# Patient Record
Sex: Female | Born: 1975 | Race: Black or African American | Hispanic: No | Smoking: Former smoker
Health system: Southern US, Community
[De-identification: ages and names within clinical notes are randomized; demographics above are authoritative.]

## PROBLEM LIST (undated history)

## (undated) DIAGNOSIS — K219 Gastro-esophageal reflux disease without esophagitis: Secondary | ICD-10-CM

## (undated) DIAGNOSIS — K589 Irritable bowel syndrome without diarrhea: Secondary | ICD-10-CM

## (undated) DIAGNOSIS — I10 Essential (primary) hypertension: Secondary | ICD-10-CM

## (undated) DIAGNOSIS — E669 Obesity, unspecified: Secondary | ICD-10-CM

## (undated) DIAGNOSIS — D649 Anemia, unspecified: Secondary | ICD-10-CM

## (undated) DIAGNOSIS — B1920 Unspecified viral hepatitis C without hepatic coma: Secondary | ICD-10-CM

## (undated) DIAGNOSIS — R768 Other specified abnormal immunological findings in serum: Secondary | ICD-10-CM

## (undated) DIAGNOSIS — G4730 Sleep apnea, unspecified: Secondary | ICD-10-CM

## (undated) DIAGNOSIS — F419 Anxiety disorder, unspecified: Secondary | ICD-10-CM

## (undated) DIAGNOSIS — D1803 Hemangioma of intra-abdominal structures: Secondary | ICD-10-CM

## (undated) HISTORY — DX: Anxiety disorder, unspecified: F41.9

## (undated) HISTORY — DX: Anemia, unspecified: D64.9

## (undated) HISTORY — PX: NASAL SINUS SURGERY: SHX719

## (undated) HISTORY — DX: Hemangioma of intra-abdominal structures: D18.03

## (undated) HISTORY — DX: Obesity, unspecified: E66.9

## (undated) HISTORY — DX: Gastro-esophageal reflux disease without esophagitis: K21.9

## (undated) HISTORY — DX: Sleep apnea, unspecified: G47.30

## (undated) HISTORY — DX: Other specified abnormal immunological findings in serum: R76.8

## (undated) HISTORY — DX: Irritable bowel syndrome, unspecified: K58.9

## (undated) HISTORY — DX: Essential (primary) hypertension: I10

## (undated) HISTORY — PX: TUBAL LIGATION: SHX77

## (undated) HISTORY — PX: DENTAL SURGERY: SHX609

---

## 1998-02-06 ENCOUNTER — Encounter: Admission: RE | Admit: 1998-02-06 | Discharge: 1998-02-06 | Payer: Self-pay | Admitting: Family Medicine

## 1998-03-21 ENCOUNTER — Emergency Department (HOSPITAL_COMMUNITY): Admission: EM | Admit: 1998-03-21 | Discharge: 1998-03-21 | Payer: Self-pay | Admitting: Emergency Medicine

## 1998-05-04 ENCOUNTER — Encounter: Admission: RE | Admit: 1998-05-04 | Discharge: 1998-05-04 | Payer: Self-pay | Admitting: Family Medicine

## 1998-05-05 ENCOUNTER — Encounter: Admission: RE | Admit: 1998-05-05 | Discharge: 1998-05-05 | Payer: Self-pay | Admitting: Family Medicine

## 1998-05-23 ENCOUNTER — Encounter: Admission: RE | Admit: 1998-05-23 | Discharge: 1998-05-23 | Payer: Self-pay | Admitting: Family Medicine

## 1998-06-01 ENCOUNTER — Encounter: Admission: RE | Admit: 1998-06-01 | Discharge: 1998-06-01 | Payer: Self-pay | Admitting: Family Medicine

## 1998-06-22 ENCOUNTER — Other Ambulatory Visit: Admission: RE | Admit: 1998-06-22 | Discharge: 1998-06-22 | Payer: Self-pay | Admitting: *Deleted

## 1998-06-22 ENCOUNTER — Encounter: Admission: RE | Admit: 1998-06-22 | Discharge: 1998-06-22 | Payer: Self-pay | Admitting: Family Medicine

## 1998-08-04 ENCOUNTER — Encounter: Admission: RE | Admit: 1998-08-04 | Discharge: 1998-08-04 | Payer: Self-pay | Admitting: Family Medicine

## 1998-11-08 ENCOUNTER — Encounter: Admission: RE | Admit: 1998-11-08 | Discharge: 1998-11-08 | Payer: Self-pay | Admitting: Family Medicine

## 1999-02-22 ENCOUNTER — Encounter: Admission: RE | Admit: 1999-02-22 | Discharge: 1999-02-22 | Payer: Self-pay | Admitting: Family Medicine

## 1999-03-08 ENCOUNTER — Encounter: Admission: RE | Admit: 1999-03-08 | Discharge: 1999-03-08 | Payer: Self-pay | Admitting: Family Medicine

## 1999-10-04 ENCOUNTER — Encounter: Admission: RE | Admit: 1999-10-04 | Discharge: 1999-10-04 | Payer: Self-pay | Admitting: Family Medicine

## 2000-04-29 ENCOUNTER — Other Ambulatory Visit: Admission: RE | Admit: 2000-04-29 | Discharge: 2000-04-29 | Payer: Self-pay | Admitting: Family Medicine

## 2000-05-06 ENCOUNTER — Encounter: Admission: RE | Admit: 2000-05-06 | Discharge: 2000-05-06 | Payer: Self-pay | Admitting: Sports Medicine

## 2000-06-24 ENCOUNTER — Encounter: Admission: RE | Admit: 2000-06-24 | Discharge: 2000-06-24 | Payer: Self-pay | Admitting: Family Medicine

## 2000-07-16 ENCOUNTER — Encounter: Admission: RE | Admit: 2000-07-16 | Discharge: 2000-07-16 | Payer: Self-pay | Admitting: Family Medicine

## 2000-08-18 ENCOUNTER — Encounter: Admission: RE | Admit: 2000-08-18 | Discharge: 2000-08-18 | Payer: Self-pay | Admitting: Family Medicine

## 2000-09-16 ENCOUNTER — Encounter: Admission: RE | Admit: 2000-09-16 | Discharge: 2000-09-16 | Payer: Self-pay | Admitting: Family Medicine

## 2000-09-30 ENCOUNTER — Ambulatory Visit (HOSPITAL_COMMUNITY): Admission: RE | Admit: 2000-09-30 | Discharge: 2000-09-30 | Payer: Self-pay

## 2000-10-09 ENCOUNTER — Encounter: Admission: RE | Admit: 2000-10-09 | Discharge: 2000-10-09 | Payer: Self-pay | Admitting: Family Medicine

## 2000-11-10 ENCOUNTER — Encounter: Admission: RE | Admit: 2000-11-10 | Discharge: 2000-11-10 | Payer: Self-pay | Admitting: Family Medicine

## 2000-11-14 ENCOUNTER — Encounter: Admission: RE | Admit: 2000-11-14 | Discharge: 2000-11-14 | Payer: Self-pay | Admitting: Family Medicine

## 2000-12-09 ENCOUNTER — Encounter: Admission: RE | Admit: 2000-12-09 | Discharge: 2000-12-09 | Payer: Self-pay | Admitting: Sports Medicine

## 2000-12-25 ENCOUNTER — Encounter: Admission: RE | Admit: 2000-12-25 | Discharge: 2000-12-25 | Payer: Self-pay | Admitting: Family Medicine

## 2001-01-07 ENCOUNTER — Encounter: Admission: RE | Admit: 2001-01-07 | Discharge: 2001-01-07 | Payer: Self-pay | Admitting: Family Medicine

## 2001-01-26 ENCOUNTER — Encounter: Admission: RE | Admit: 2001-01-26 | Discharge: 2001-01-26 | Payer: Self-pay | Admitting: Family Medicine

## 2001-02-03 ENCOUNTER — Encounter: Admission: RE | Admit: 2001-02-03 | Discharge: 2001-02-03 | Payer: Self-pay | Admitting: Family Medicine

## 2001-02-10 ENCOUNTER — Encounter: Admission: RE | Admit: 2001-02-10 | Discharge: 2001-02-10 | Payer: Self-pay | Admitting: Family Medicine

## 2001-02-10 ENCOUNTER — Inpatient Hospital Stay (HOSPITAL_COMMUNITY): Admission: AD | Admit: 2001-02-10 | Discharge: 2001-02-10 | Payer: Self-pay | Admitting: *Deleted

## 2001-02-14 ENCOUNTER — Inpatient Hospital Stay (HOSPITAL_COMMUNITY): Admission: AD | Admit: 2001-02-14 | Discharge: 2001-02-14 | Payer: Self-pay | Admitting: *Deleted

## 2001-02-14 ENCOUNTER — Inpatient Hospital Stay (HOSPITAL_COMMUNITY): Admission: AD | Admit: 2001-02-14 | Discharge: 2001-02-17 | Payer: Self-pay | Admitting: *Deleted

## 2001-02-14 ENCOUNTER — Encounter (INDEPENDENT_AMBULATORY_CARE_PROVIDER_SITE_OTHER): Payer: Self-pay | Admitting: Specialist

## 2001-04-08 ENCOUNTER — Encounter: Admission: RE | Admit: 2001-04-08 | Discharge: 2001-04-08 | Payer: Self-pay | Admitting: Family Medicine

## 2001-04-08 ENCOUNTER — Other Ambulatory Visit: Admission: RE | Admit: 2001-04-08 | Discharge: 2001-04-08 | Payer: Self-pay | Admitting: Family Medicine

## 2001-10-08 ENCOUNTER — Encounter: Admission: RE | Admit: 2001-10-08 | Discharge: 2001-10-08 | Payer: Self-pay | Admitting: Family Medicine

## 2001-10-16 ENCOUNTER — Encounter: Admission: RE | Admit: 2001-10-16 | Discharge: 2001-10-16 | Payer: Self-pay | Admitting: Family Medicine

## 2001-10-16 ENCOUNTER — Encounter: Payer: Self-pay | Admitting: Family Medicine

## 2001-11-06 ENCOUNTER — Encounter: Admission: RE | Admit: 2001-11-06 | Discharge: 2001-11-06 | Payer: Self-pay | Admitting: Family Medicine

## 2002-05-19 ENCOUNTER — Encounter: Admission: RE | Admit: 2002-05-19 | Discharge: 2002-05-19 | Payer: Self-pay | Admitting: Family Medicine

## 2002-05-19 ENCOUNTER — Other Ambulatory Visit: Admission: RE | Admit: 2002-05-19 | Discharge: 2002-05-19 | Payer: Self-pay | Admitting: Family Medicine

## 2002-09-28 ENCOUNTER — Encounter: Admission: RE | Admit: 2002-09-28 | Discharge: 2002-09-28 | Payer: Self-pay | Admitting: Family Medicine

## 2002-11-18 ENCOUNTER — Encounter: Admission: RE | Admit: 2002-11-18 | Discharge: 2002-11-18 | Payer: Self-pay | Admitting: Family Medicine

## 2002-12-09 ENCOUNTER — Encounter: Admission: RE | Admit: 2002-12-09 | Discharge: 2002-12-09 | Payer: Self-pay | Admitting: Family Medicine

## 2003-07-21 ENCOUNTER — Encounter: Admission: RE | Admit: 2003-07-21 | Discharge: 2003-07-21 | Payer: Self-pay | Admitting: Sports Medicine

## 2004-01-24 ENCOUNTER — Encounter (INDEPENDENT_AMBULATORY_CARE_PROVIDER_SITE_OTHER): Payer: Self-pay | Admitting: *Deleted

## 2004-01-24 LAB — CONVERTED CEMR LAB

## 2004-02-06 ENCOUNTER — Encounter: Admission: RE | Admit: 2004-02-06 | Discharge: 2004-02-06 | Payer: Self-pay | Admitting: Family Medicine

## 2004-08-15 ENCOUNTER — Other Ambulatory Visit: Admission: RE | Admit: 2004-08-15 | Discharge: 2004-08-15 | Payer: Self-pay | Admitting: Obstetrics and Gynecology

## 2005-06-13 ENCOUNTER — Emergency Department (HOSPITAL_COMMUNITY): Admission: EM | Admit: 2005-06-13 | Discharge: 2005-06-13 | Payer: Self-pay | Admitting: Emergency Medicine

## 2006-10-15 ENCOUNTER — Emergency Department (HOSPITAL_COMMUNITY): Admission: EM | Admit: 2006-10-15 | Discharge: 2006-10-15 | Payer: Self-pay | Admitting: Emergency Medicine

## 2006-12-18 DIAGNOSIS — G44209 Tension-type headache, unspecified, not intractable: Secondary | ICD-10-CM | POA: Insufficient documentation

## 2006-12-18 DIAGNOSIS — E669 Obesity, unspecified: Secondary | ICD-10-CM | POA: Insufficient documentation

## 2006-12-18 DIAGNOSIS — K219 Gastro-esophageal reflux disease without esophagitis: Secondary | ICD-10-CM

## 2006-12-18 DIAGNOSIS — Z6841 Body Mass Index (BMI) 40.0 and over, adult: Secondary | ICD-10-CM

## 2006-12-19 ENCOUNTER — Encounter (INDEPENDENT_AMBULATORY_CARE_PROVIDER_SITE_OTHER): Payer: Self-pay | Admitting: *Deleted

## 2007-07-01 ENCOUNTER — Ambulatory Visit: Payer: Self-pay | Admitting: Family Medicine

## 2007-07-01 ENCOUNTER — Encounter (INDEPENDENT_AMBULATORY_CARE_PROVIDER_SITE_OTHER): Payer: Self-pay | Admitting: Family Medicine

## 2007-07-01 DIAGNOSIS — L210 Seborrhea capitis: Secondary | ICD-10-CM

## 2007-07-01 DIAGNOSIS — R030 Elevated blood-pressure reading, without diagnosis of hypertension: Secondary | ICD-10-CM

## 2007-07-07 ENCOUNTER — Encounter (INDEPENDENT_AMBULATORY_CARE_PROVIDER_SITE_OTHER): Payer: Self-pay | Admitting: Family Medicine

## 2007-07-15 ENCOUNTER — Ambulatory Visit: Payer: Self-pay | Admitting: Family Medicine

## 2007-07-15 ENCOUNTER — Encounter (INDEPENDENT_AMBULATORY_CARE_PROVIDER_SITE_OTHER): Payer: Self-pay | Admitting: Family Medicine

## 2007-07-15 DIAGNOSIS — I10 Essential (primary) hypertension: Secondary | ICD-10-CM

## 2007-07-15 LAB — CONVERTED CEMR LAB
BUN: 11 mg/dL (ref 6–23)
Calcium: 9.2 mg/dL (ref 8.4–10.5)
Cholesterol: 172 mg/dL (ref 0–200)
Creatinine, Ser: 0.71 mg/dL (ref 0.40–1.20)
HDL: 60 mg/dL (ref >39)
Triglycerides: 53 mg/dL (ref <150)

## 2007-07-16 ENCOUNTER — Encounter (INDEPENDENT_AMBULATORY_CARE_PROVIDER_SITE_OTHER): Payer: Self-pay | Admitting: Family Medicine

## 2007-07-30 ENCOUNTER — Ambulatory Visit: Payer: Self-pay | Admitting: Family Medicine

## 2007-07-30 ENCOUNTER — Encounter (INDEPENDENT_AMBULATORY_CARE_PROVIDER_SITE_OTHER): Payer: Self-pay | Admitting: Family Medicine

## 2007-07-30 LAB — CONVERTED CEMR LAB
BUN: 11 mg/dL (ref 6–23)
CO2: 26 meq/L (ref 19–32)
Chloride: 102 meq/L (ref 96–112)
Creatinine, Ser: 0.68 mg/dL (ref 0.40–1.20)
Glucose, Bld: 103 mg/dL — ABNORMAL HIGH (ref 70–99)

## 2007-07-31 ENCOUNTER — Encounter (INDEPENDENT_AMBULATORY_CARE_PROVIDER_SITE_OTHER): Payer: Self-pay | Admitting: Family Medicine

## 2008-08-11 ENCOUNTER — Ambulatory Visit: Payer: Self-pay | Admitting: Family Medicine

## 2008-09-22 ENCOUNTER — Encounter: Payer: Self-pay | Admitting: *Deleted

## 2008-12-27 ENCOUNTER — Ambulatory Visit: Payer: Self-pay | Admitting: Family Medicine

## 2008-12-27 ENCOUNTER — Encounter: Payer: Self-pay | Admitting: Family Medicine

## 2008-12-27 DIAGNOSIS — R109 Unspecified abdominal pain: Secondary | ICD-10-CM | POA: Insufficient documentation

## 2008-12-29 ENCOUNTER — Ambulatory Visit (HOSPITAL_COMMUNITY): Admission: RE | Admit: 2008-12-29 | Discharge: 2008-12-29 | Payer: Self-pay | Admitting: Family Medicine

## 2009-01-02 ENCOUNTER — Encounter: Payer: Self-pay | Admitting: Family Medicine

## 2009-01-02 LAB — CONVERTED CEMR LAB
BUN: 12 mg/dL (ref 6–23)
CO2: 25 meq/L (ref 19–32)
Creatinine, Ser: 0.83 mg/dL (ref 0.40–1.20)
Glucose, Bld: 97 mg/dL (ref 70–99)
Sodium: 143 meq/L (ref 135–145)
Total Bilirubin: 0.2 mg/dL — ABNORMAL LOW (ref 0.3–1.2)
Total Protein: 8 g/dL (ref 6.0–8.3)

## 2009-03-15 ENCOUNTER — Ambulatory Visit: Payer: Self-pay | Admitting: Family Medicine

## 2009-03-15 LAB — CONVERTED CEMR LAB
H Pylori IgG: POSITIVE
HCT: 34.3 % — ABNORMAL LOW (ref 36.0–46.0)
Platelets: 400 10*3/uL (ref 150–400)
RBC: 3.89 M/uL (ref 3.87–5.11)
WBC: 8.8 10*3/uL (ref 4.0–10.5)

## 2009-03-16 ENCOUNTER — Telehealth: Payer: Self-pay | Admitting: Family Medicine

## 2009-05-10 ENCOUNTER — Telehealth: Payer: Self-pay | Admitting: Family Medicine

## 2009-12-11 DIAGNOSIS — R768 Other specified abnormal immunological findings in serum: Secondary | ICD-10-CM

## 2009-12-11 HISTORY — DX: Other specified abnormal immunological findings in serum: R76.8

## 2010-02-07 ENCOUNTER — Ambulatory Visit: Payer: Self-pay | Admitting: Family Medicine

## 2010-02-07 ENCOUNTER — Encounter: Payer: Self-pay | Admitting: Family Medicine

## 2010-02-07 DIAGNOSIS — Z8619 Personal history of other infectious and parasitic diseases: Secondary | ICD-10-CM | POA: Insufficient documentation

## 2010-02-07 DIAGNOSIS — A048 Other specified bacterial intestinal infections: Secondary | ICD-10-CM

## 2010-02-07 LAB — CONVERTED CEMR LAB
Albumin: 4.2 g/dL (ref 3.5–5.2)
BUN: 8 mg/dL (ref 6–23)
Calcium: 9.2 mg/dL (ref 8.4–10.5)
Chloride: 101 meq/L (ref 96–112)
Glucose, Bld: 94 mg/dL (ref 70–99)
HDL: 51 mg/dL (ref >39)
Hemoglobin: 11.2 g/dL — ABNORMAL LOW (ref 12.0–15.0)
MCHC: 32.9 g/dL (ref 30.0–36.0)
Potassium: 4.1 meq/L (ref 3.5–5.3)
RBC: 3.88 M/uL (ref 3.87–5.11)
Triglycerides: 103 mg/dL (ref <150)

## 2010-02-08 ENCOUNTER — Telehealth: Payer: Self-pay | Admitting: Family Medicine

## 2010-11-20 NOTE — Progress Notes (Signed)
Summary: refill  Phone Note Refill Request Call back at Home Phone [phone] Message from:  Patient  Refills Requested: Medication #1:  LISINOPRIL-HYDROCHLOROTHIAZIDE 20-25 MG TABS take one tablet daily Initial call taken by: De Nurse,  February 08, 2010 11:18 AM Caller: Patient Initial call taken by: De Nurse,  February 08, 2010 11:17 AM    I refilled it with 5 refills right before she called.  [facility] [address].  Let me know if they do not recieve it in the next severla hours.  Thanks! Delbert Harness MD  February 08, 2010 11:23 AM
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
## 2010-11-20 NOTE — Progress Notes (Signed)
Summary: positive H. Pylori  Phone Note Outgoing Call   Summary of Call: Left message on patient's voicemail.  I told her H. Pylori is positive and I have sent in prescription for amoxicillin and clarithromycin for two week course and I woudl like her to take protonix twice a day for 2 weeks, then go back to once a day.  If she continues to have pain after treatment, needs to return.   I would retest H. Pylori in 4 weeks.  Also advised multivitamin for slightly low hemoglobin- may be due to gastritis.  Would also recheck in future. Initial call taken by: Delbert Harness MD,  February 08, 2010 11:12 AM

## 2010-12-05 ENCOUNTER — Encounter: Payer: Self-pay | Admitting: *Deleted

## 2011-02-11 ENCOUNTER — Other Ambulatory Visit: Payer: Self-pay | Admitting: Family Medicine

## 2011-02-11 NOTE — Telephone Encounter (Signed)
Refill request

## 2011-02-13 NOTE — Telephone Encounter (Signed)
Has not been seen since 02/07/10

## 2011-02-13 NOTE — Telephone Encounter (Signed)
Refill request

## 2011-02-27 ENCOUNTER — Encounter: Payer: Self-pay | Admitting: Family Medicine

## 2011-03-08 NOTE — Op Note (Signed)
[facility]  Patient:    Kristin Lawrence, Kristin Lawrence                       MRN: [id number] Proc. Date: 02/13/01 Adm. Date:  [id number] Disc. Date: [id number] Attending:  Michaelle Copas Dictator:   Jamey Reas, M.D.                           Operative Report  DATE OF BIRTH:                [birth year]  PREOPERATIVE DIAGNOSIS:       Multiparity, desires permanent                               sterilization.  POSTOPERATIVE DIAGNOSIS:      Multiparity, desires permanent                               sterilization.  PROCEDURE:                    Postpartum tubal ligation, Pomeroy method.  SURGEON:                      Roseanna Rainbow, M.D.  ASSISTANT:                    Jamey Reas, M.D.  ANESTHESIA:                   Epidural.  COMPLICATIONS:                None.  ESTIMATED BLOOD LOSS:         Less than 20 cc.  FLUIDS:                       Less than 500 Lactated Ringers.  INDICATION:                   A 35 year old G5 now P2 0 3 2 postpartum day #2 status post normal spontaneous vaginal delivery of a female, doing well.  Risks and benefits of the procedure were discussed with the patient including a risk of failure 3-5:1000 with an increased risk of ectopic gestation if pregnancy occurs.  FINDINGS:                     Normal uterus, tubes, and ovaries.  PROCEDURE:                    The patient was taken to the operating room, where epidural was found to be adequate.  A small, transverse infraumbilical skin incision was then made with the scalpel.  The incision was then carried down through the underlying fascia, until the peritoneum was identified and entered.  The peritoneum was noted to be free of any adhesions and the incision was then extended with the Metzenbaum scissors.  The patients left fallopian tube was then identified, brought to the incision, and grasped with a Babcock clamp.  The tube was then followed out  to the fimbriae.  The Babcock clamp was then used to grasp the tube approximately 4 cm from the ______ region.  A 3 cm segment of the tube was then ligated and  two free ties of plain gut and excised.  Good hemostasis was noted and the tube was returned to the abdomen.  The right fallopian tube was then ligated in a similar fashion and a 3 cm segment excised.  Excellent hemostasis was noted and the tube returned to the abdomen.  The peritoneum and fascia were then closed in a single layer using 3-0 Vicryl suture.  The skin was then closed in a subcuticular fashion using 4-0 Monocryl suture.  The patient tolerated the procedure well.  Sponge, lap, and needle counts were correct x 2.  The patient was taken to the recovery room in stable condition.  Pathology segments of right and left fallopian tubes. DD:  02/16/01 TD:  02/16/01 Job: [id number]

## 2011-03-13 ENCOUNTER — Encounter: Payer: Self-pay | Admitting: Family Medicine

## 2011-03-21 ENCOUNTER — Encounter: Payer: Self-pay | Admitting: Family Medicine

## 2011-03-21 ENCOUNTER — Other Ambulatory Visit (HOSPITAL_COMMUNITY)
Admission: RE | Admit: 2011-03-21 | Discharge: 2011-03-21 | Disposition: A | Discharge status: Home / Self Care | Payer: 59 | Source: Ambulatory Visit | Attending: Family Medicine | Admitting: Family Medicine

## 2011-03-21 ENCOUNTER — Ambulatory Visit (INDEPENDENT_AMBULATORY_CARE_PROVIDER_SITE_OTHER): Payer: 59 | Admitting: Family Medicine

## 2011-03-21 VITALS — BP 131/76 | HR 70 | Temp 98.7°F | Ht 66.75 in | Wt 280.0 lb

## 2011-03-21 DIAGNOSIS — N739 Female pelvic inflammatory disease, unspecified: Secondary | ICD-10-CM

## 2011-03-21 DIAGNOSIS — A499 Bacterial infection, unspecified: Secondary | ICD-10-CM

## 2011-03-21 DIAGNOSIS — Z01419 Encounter for gynecological examination (general) (routine) without abnormal findings: Secondary | ICD-10-CM | POA: Insufficient documentation

## 2011-03-21 DIAGNOSIS — E669 Obesity, unspecified: Secondary | ICD-10-CM

## 2011-03-21 DIAGNOSIS — Z124 Encounter for screening for malignant neoplasm of cervix: Secondary | ICD-10-CM

## 2011-03-21 DIAGNOSIS — B9689 Other specified bacterial agents as the cause of diseases classified elsewhere: Secondary | ICD-10-CM

## 2011-03-21 DIAGNOSIS — N760 Acute vaginitis: Secondary | ICD-10-CM

## 2011-03-21 DIAGNOSIS — A048 Other specified bacterial intestinal infections: Secondary | ICD-10-CM

## 2011-03-21 DIAGNOSIS — E282 Polycystic ovarian syndrome: Secondary | ICD-10-CM | POA: Insufficient documentation

## 2011-03-21 LAB — CBC
MCH: 29.6 pg (ref 26.0–34.0)
MCV: 89.4 fL (ref 78.0–100.0)
Platelets: 308 10*3/uL (ref 150–400)
RBC: 4.05 MIL/uL (ref 3.87–5.11)
RDW: 13.7 % (ref 11.5–15.5)

## 2011-03-21 LAB — POCT WET PREP (WET MOUNT): Trichomonas Wet Prep HPF POC: NEGATIVE

## 2011-03-21 LAB — LIPID PANEL
Cholesterol: 179 mg/dL (ref 0–200)
HDL: 57 mg/dL (ref >39)
Total CHOL/HDL Ratio: 3.1 Ratio
Triglycerides: 85 mg/dL (ref <150)
VLDL: 17 mg/dL (ref 0–40)

## 2011-03-21 LAB — HIV ANTIBODY (ROUTINE TESTING W REFLEX): HIV: NONREACTIVE

## 2011-03-21 LAB — COMPREHENSIVE METABOLIC PANEL
ALT: 25 U/L (ref 0–35)
BUN: 10 mg/dL (ref 6–23)
CO2: 31 mEq/L (ref 19–32)
Creat: 0.76 mg/dL (ref 0.40–1.20)
Total Bilirubin: 0.4 mg/dL (ref 0.3–1.2)

## 2011-03-21 MED ORDER — OMEPRAZOLE 20 MG PO CPDR: 20.0000 mg | DELAYED_RELEASE_CAPSULE | Freq: Every day | ORAL | Status: DC

## 2011-03-21 NOTE — Assessment & Plan Note (Signed)
Screened for STDS given open relationships.  Pap today, up to date on recommended preventative care.

## 2011-03-21 NOTE — Patient Instructions (Addendum)
Congrats on your weight loss!  Think about adding some daily exercise to your everyday activity level If your insurance covers  Nutrition counseling (tell them you have high blood pressure) that would be a good option for you I will send you letter in the mail with your normal results in 1-2 weeks. Follow-up in 6 months or sooner if neededAcid Reflux (GERD) Acid reflux is also called gastroesophageal reflux disease (GERD). Your stomach makes acid to help digest food. Acid reflux happens when acid from your stomach goes into the tube between your mouth and stomach (esophagus). Your stomach is protected from the acid, but this tube is not. When acid gets into the tube, it may cause a burning feeling in the chest (heartburn). Besides heartburn, other health problems can happen if the acid keeps going into the tube. Some causes of acid reflux include:  Being overweight.   Smoking.   Drinking alcohol.   Eating large meals.   Eating meals and then going to bed right away.   Eating certain foods.   Increased stomach acid production.  HOME CARE  Take all medicine as told by your doctor.   You may need to:   Lose weight.   Avoid alcohol.   Quit smoking.   Do not eat big meals. It is better to eat smaller meals throughout the day.   Do not eat a meal and then nap or go to bed.   Sleep with your head higher than your stomach.   Avoid foods that bother you.   You may need more tests, or you may need to see a special doctor.  GET HELP RIGHT AWAY IF:  You have chest pain that is different than before.   You have pain that goes to your arms, jaw, or between your shoulder blades.   You throw up (vomit) blood, dark brown liquid, or your throw up looks like coffee grounds.   You have trouble swallowing.   You have trouble breathing or cannot stop coughing.   You feel dizzy or pass out.   Your skin is cool, wet, and pale.   Your medicine is not helping.  MAKE SURE YOU:     Understand these instructions.   Will watch your condition.   Will get help right away if you are not doing well or get worse.  Document Released: 03/25/2008 Document Re-Released: 01/01/2010 [facility] Patient Information 2011 [place], [place].

## 2011-03-21 NOTE — Progress Notes (Signed)
  Subjective:    Patient ID: Kristin Lawrence, female    DOB: 01-27-1976, 35 y.o.   MRN: [id number]  HPI Annual Gynecological Exam  [phone] Wt Readings from Last 3 Encounters:  03/21/11 280 lb (127.007 kg)  02/07/10 304 lb 3.2 oz (137.984 kg)  03/15/09 297 lb 11.2 oz (135.036 kg)   Last period: 03/11/2011 Regular periods: yes Heavy bleeding: no  Sexually active: yes Birth control or hormonal therapy:no Hx of STD: Patient desires STD screening, yes open marraige Dyspareunia: No Hot flashes: No Vaginal discharge: normal mucous Dysuria:No   Last mammogram: none Breast mass or concerns: No Last Pap:08/2008 History of abnormal pap: No  FH of breast, uterine, ovarian, colon cancer: yes- colon ca in mom     Review of Systems  Constitutional: Negative for fever and unexpected weight change.  Respiratory: Negative for cough and chest tightness.   Cardiovascular: Negative for chest pain.  Gastrointestinal: Negative for nausea, abdominal pain, diarrhea and constipation.  Genitourinary: Negative for dysuria.  Neurological: Negative for dizziness and headaches.  Psychiatric/Behavioral: Negative for sleep disturbance.       Objective:   Physical Exam  Constitutional: She appears well-developed and well-nourished.  HENT:  Head: Normocephalic and atraumatic.  Right Ear: External ear normal.  Left Ear: External ear normal.  Nose: Nose normal.  Mouth/Throat: Oropharynx is clear and moist. No oropharyngeal exudate.  Eyes: Conjunctivae and EOM are normal. Pupils are equal, round, and reactive to light.  Neck: Normal range of motion. Neck supple. No thyromegaly present.  Cardiovascular: Normal rate, regular rhythm and normal heart sounds.   No murmur heard. Pulmonary/Chest: Effort normal and breath sounds normal. No respiratory distress. She has no wheezes. She has no rhonchi. She has no rales.  Abdominal: Soft. Bowel sounds are normal. There is no hepatosplenomegaly. There is no  tenderness. There is no rebound and no guarding.  Genitourinary: Vagina normal and uterus normal. No breast tenderness or discharge. Cervix exhibits no motion tenderness, no discharge and no friability. Right adnexum displays no mass, no tenderness and no fullness. Left adnexum displays no mass and no tenderness. No bleeding around the vagina. No vaginal discharge found.  Lymphadenopathy:    She has no cervical adenopathy.          Assessment & Plan:

## 2011-03-21 NOTE — Assessment & Plan Note (Signed)
Has lost almost 25 pounds in the past year. Congratulated her improvement.  Encouraged continued progress and offered nutrition counseling if desired.

## 2011-03-22 DIAGNOSIS — B9689 Other specified bacterial agents as the cause of diseases classified elsewhere: Secondary | ICD-10-CM | POA: Insufficient documentation

## 2011-03-22 MED ORDER — METRONIDAZOLE 500 MG PO TABS: 500.0000 mg | ORAL_TABLET | Freq: Two times a day (BID) | ORAL | Status: AC

## 2011-03-22 MED ORDER — LISINOPRIL-HYDROCHLOROTHIAZIDE 20-25 MG PO TABS: 1.0000 | ORAL_TABLET | Freq: Every day | ORAL | Status: DC

## 2011-03-22 NOTE — Progress Notes (Signed)
Addended by: Macy Mis on: 03/22/2011 04:58 PM   Modules accepted: Orders

## 2011-03-27 ENCOUNTER — Other Ambulatory Visit: Payer: Self-pay | Admitting: Family Medicine

## 2011-03-27 MED ORDER — LISINOPRIL-HYDROCHLOROTHIAZIDE 20-25 MG PO TABS: 1.0000 | ORAL_TABLET | Freq: Every day | ORAL | Status: DC

## 2011-11-12 ENCOUNTER — Encounter: Payer: Self-pay | Admitting: *Deleted

## 2011-11-12 ENCOUNTER — Telehealth: Payer: Self-pay | Admitting: Internal Medicine

## 2011-11-12 NOTE — Telephone Encounter (Signed)
Patient asking for OV for diarrhea, abd. Pain. States she has a hx of IBS and she saw Dr. Juanda Chance years ago. Scheduled patient on 11/19/11 8:30/8:45 AM with Dr. Juanda Chance. Letter to patient

## 2011-11-14 ENCOUNTER — Encounter: Payer: Self-pay | Admitting: *Deleted

## 2011-11-19 ENCOUNTER — Ambulatory Visit (INDEPENDENT_AMBULATORY_CARE_PROVIDER_SITE_OTHER): Payer: 59 | Admitting: Internal Medicine

## 2011-11-19 ENCOUNTER — Other Ambulatory Visit: Payer: 59

## 2011-11-19 ENCOUNTER — Encounter: Payer: Self-pay | Admitting: Internal Medicine

## 2011-11-19 VITALS — BP 138/80 | HR 74 | Ht 66.0 in | Wt 287.0 lb

## 2011-11-19 DIAGNOSIS — K589 Irritable bowel syndrome without diarrhea: Secondary | ICD-10-CM

## 2011-11-19 DIAGNOSIS — A09 Infectious gastroenteritis and colitis, unspecified: Secondary | ICD-10-CM

## 2011-11-19 DIAGNOSIS — K5289 Other specified noninfective gastroenteritis and colitis: Secondary | ICD-10-CM

## 2011-11-19 MED ORDER — PEG-KCL-NACL-NASULF-NA ASC-C 100 G PO SOLR: 1.0000 | Freq: Once | ORAL | Status: DC

## 2011-11-19 MED ORDER — CIPROFLOXACIN HCL 250 MG PO TABS: ORAL_TABLET | ORAL | Status: DC

## 2011-11-19 MED ORDER — METRONIDAZOLE 250 MG PO TABS: ORAL_TABLET | ORAL | Status: DC

## 2011-11-19 MED ORDER — DICYCLOMINE HCL 20 MG PO TABS: 20.0000 mg | ORAL_TABLET | Freq: Two times a day (BID) | ORAL | Status: DC

## 2011-11-19 NOTE — Patient Instructions (Addendum)
You have been scheduled for a colonoscopy. Please follow written instructions given to you at your visit today.  Please pick up your prep kit at the pharmacy within the next 2-3 days. We have sent the following medications to your pharmacy for you to pick up at your convenience: Flagyl 250 three times daily x 5 days Cipro 250 mg twice daily x 5 days Bentyl 20 mg twice daily Your physician has requested that you go to the basement for the following lab work before leaving today: Stool culture, Stool for C Diff, Lactoferrin CC: Dr Aviva Signs

## 2011-11-19 NOTE — Progress Notes (Signed)
Kristin Lawrence 06/17/1976 MRN [id number]   History of Present Illness:  This is a 36 year old African American female with severe diarrhea of 2 weeks duration. She has been feeling better in the last 4 days. Her symptoms actually started about 2 years ago after she was treated for H. pylori with 2 antibiotics. Her stools became more frequent, but formed. There was no blood in it. In the last 2 weeks, she was having up to 11 watery stools a day and nocturnally  associated with cramps. There was no nausea, vomiting or fever. Her weight has remained stable. She has not traveled or taken any antibiotics recently. Her family has not been affected. There is a strong family history of colon cancer in her mother who was diagnosed at the age of 61 and underwent a colon and liver resection. Patient is interested in having a colonoscopy. She has a history of mild anemia with a hemoglobin of 11.0 and hematocrit of 34.3. Her upper abdominal ultrasound in March 2010 was negative. There is no family history of colitis or Crohn's disease. She admits to partial lactose intolerance.   Past Medical History  Diagnosis Date  . Helicobacter pylori ab+ 12/11/09    Per Dr Leonie Green note  . Liver hemangioma   . Galactorrhea   . Benign hypertension   . GERD (gastroesophageal reflux disease)   . IBS (irritable bowel syndrome)   . Obesity   . Anemia   . Anxiety   . Sleep apnea    Past Surgical History  Procedure Date  . Tubal ligation   . Nasal sinus surgery     reports that she quit smoking about 14 years ago. Her smoking use included Cigarettes. She has never used smokeless tobacco. She reports that she drinks about 8.4 ounces of alcohol per week. She reports that she does not use illicit drugs. family history includes Colon cancer (age of onset:52) in her mother; Cystic fibrosis in her mother; Diabetes in her maternal grandmother; Esophageal cancer in her maternal uncle; Heart disease (age of onset:30) in her  mother; Liver cancer in her maternal uncle; Pancreatic cancer in her maternal grandmother; Stomach cancer in her paternal grandfather; and Stroke in her maternal uncle and paternal uncle. No Known Allergies      Review of Systems: Denies heartburn dysphagia chest pain or shortness of breath  The remainder of the 10 point ROS is negative except as outlined in H&P   Physical Exam: General appearance  Well developed, in no distress., Overweight Eyes- non icteric. HEENT nontraumatic, normocephalic. Mouth no lesions, tongue papillated, no cheilosis. Neck supple without adenopathy, thyroid not enlarged, no carotid bruits, no JVD. Lungs Clear to auscultation bilaterally. Cor normal S1, normal S2, regular rhythm, no murmur,  quiet precordium. Abdomen: Soft obese with normal active bowel sounds. Mild tenderness throughout the abdomen. No distention. No abnormal rushes. Rectal: No stool in the ampulla Extremities no pedal edema. Skin no lesions. Multiple tattoos and body piercings Neurological alert and oriented x 3. Psychological normal mood and affect.  Assessment and Plan:  Problem #1 Acute diarrheal illness preceded by a change in her bowel habits of 2 years duration. We need to rule out new onset of inflammatory bowel disease or possibly irritable bowel syndrome. The acute diarrhea illness is consistent with infectious gastroenteritis. She seems to be doing better in the last 3 days except this morning when she had a watery stool. We will obtain a stool culture, lactoferrin and C. difficile toxin. We will give  an empiric treatment with Cipro 250 by mouth twice a day and Flagyl 250 mg by mouth 3 times a day for 5 days as well as Bentyl 20 mg by mouth twice a day for cramps. She will also continue on a low-residue diet.  Problem #2 Positive family history of colon cancer, at age 47 in her mother. She is interested in an early colorectal screening. We will proceed with a colonoscopy at this  time.     11/19/2011 Kristin Lawrence

## 2011-11-20 ENCOUNTER — Encounter: Payer: Self-pay | Admitting: Internal Medicine

## 2011-11-20 ENCOUNTER — Ambulatory Visit (AMBULATORY_SURGERY_CENTER): Payer: 59 | Admitting: Internal Medicine

## 2011-11-20 DIAGNOSIS — D126 Benign neoplasm of colon, unspecified: Secondary | ICD-10-CM

## 2011-11-20 DIAGNOSIS — Z800 Family history of malignant neoplasm of digestive organs: Secondary | ICD-10-CM

## 2011-11-20 DIAGNOSIS — K5289 Other specified noninfective gastroenteritis and colitis: Secondary | ICD-10-CM

## 2011-11-20 DIAGNOSIS — K589 Irritable bowel syndrome without diarrhea: Secondary | ICD-10-CM

## 2011-11-20 DIAGNOSIS — A09 Infectious gastroenteritis and colitis, unspecified: Secondary | ICD-10-CM

## 2011-11-20 LAB — FECAL LACTOFERRIN, QUANT: Lactoferrin: POSITIVE

## 2011-11-20 MED ORDER — SODIUM CHLORIDE 0.9 % IV SOLN: 500.0000 mL | INTRAVENOUS | Status: DC

## 2011-11-20 NOTE — Progress Notes (Signed)
Patient did not experience any of the following events: a burn prior to discharge; a fall within the facility; wrong site/side/patient/procedure/implant event; or a hospital transfer or hospital admission upon discharge from the facility. (G8907) Patient did not have preoperative order for IV antibiotic SSI prophylaxis. (G8918)  

## 2011-11-20 NOTE — Patient Instructions (Signed)
Discharge instructions given with verbal understanding. Handout on polyps given. Resume previous medications. 

## 2011-11-20 NOTE — Op Note (Signed)
[facility] [address] [place], [place]  [address]  COLONOSCOPY PROCEDURE REPORT  PATIENT:  Kristin Lawrence, Lawrence  MR#:  [id number] BIRTHDATE:  04-16-1976, 36 yrs. old  GENDER:  female ENDOSCOPIST:  Kristin Lawrence Lawrence. Juanda Chance, MD REF. BY: PROCEDURE DATE:  11/20/2011 PROCEDURE:  Colonoscopy with biopsy and snare polypectomy ASA CLASS:  Class I INDICATIONS:  unexplained diarrhea, family history of colon cancer acute diarrhea illness, positive lactoferrin change in bowl habits x 2 years mother with colon cancer at age 2 MEDICATIONS:   These medications were titrated to patient response per physician's verbal order, Versed 10 mg, Fentanyl 100 mcg, Benadryl 25 mg  DESCRIPTION OF PROCEDURE:   After the risks and benefits and of the procedure were explained, informed consent was obtained. Digital rectal exam was performed and revealed no rectal masses. The LB 180AL [id number] endoscope was introduced through the anus and advanced to the cecum, which was identified by the ileocecal valve.  The quality of the prep was good, using MoviPrep.  The instrument was then slowly withdrawn as the colon was fully examined. <<PROCEDUREIMAGES>>  FINDINGS:  A pedunculated polyp was found. 8 mm polyp at 20 cm Polyp was snared without cautery. Retrieval was successful (see image1, image7, and image8). snare polyp  This was otherwise a normal examination of the colon. Random biopsies were obtained and sent to pathology (see image9, image6, image5, image3, image1, and image2). r/o microscopic colitis   Retroflexed views in the rectum revealed no abnormalities.    The scope was then withdrawn from the patient and the procedure completed.  COMPLICATIONS:  None ENDOSCOPIC IMPRESSION: 1) Pedunculated polyp 2) Otherwise normal examination s/p random biopsies RECOMMENDATIONS: 1) Await pathology results continue Cipro,Flagyl as prescribed  REPEAT EXAM:  In 5 year(s) for.  ______________________________ Kristin Lawrence Lawrence. Juanda Chance, MD  CC:  n. eSIGNED:   Hedwig Lawrence. Kristin Lawrence Lawrence at 11/20/2011 02:43 PM  Kristin Lawrence Lawrence, [id number]

## 2011-11-21 ENCOUNTER — Telehealth: Payer: Self-pay | Admitting: *Deleted

## 2011-11-21 NOTE — Telephone Encounter (Signed)
  Follow up Call-  Call back number 11/20/2011  Post procedure Call Back phone  # [phone]  Permission to leave phone message Yes     Patient questions:  Do you have a fever, pain , or abdominal swelling? no Pain Score  0 *  Have you tolerated food without any problems? yes  Have you been able to return to your normal activities? yes  Do you have any questions about your discharge instructions: Diet   no Medications  no Follow up visit  no  Do you have questions or concerns about your Care? no  Actions: * If pain score is 4 or above: No action needed, pain <4.

## 2011-11-22 ENCOUNTER — Encounter: Payer: Self-pay | Admitting: Internal Medicine

## 2011-11-23 LAB — STOOL CULTURE

## 2011-11-26 ENCOUNTER — Encounter: Payer: Self-pay | Admitting: Internal Medicine

## 2011-11-27 ENCOUNTER — Ambulatory Visit: Payer: 59 | Admitting: Family Medicine

## 2012-04-17 ENCOUNTER — Other Ambulatory Visit: Payer: Self-pay | Admitting: Family Medicine

## 2012-04-17 NOTE — Telephone Encounter (Signed)
Pt last office visit was a year ago. Will refill medication for 2 months. For next refill pt needs to make an appointment.

## 2012-04-19 ENCOUNTER — Other Ambulatory Visit: Payer: Self-pay | Admitting: Family Medicine

## 2012-06-17 ENCOUNTER — Encounter: Payer: Self-pay | Admitting: Family Medicine

## 2012-06-17 ENCOUNTER — Ambulatory Visit (INDEPENDENT_AMBULATORY_CARE_PROVIDER_SITE_OTHER): Payer: 59 | Admitting: Family Medicine

## 2012-06-17 ENCOUNTER — Telehealth: Payer: Self-pay | Admitting: Family Medicine

## 2012-06-17 VITALS — BP 125/89 | HR 74 | Temp 99.0°F | Ht 66.6 in | Wt 290.0 lb

## 2012-06-17 DIAGNOSIS — Z6841 Body Mass Index (BMI) 40.0 and over, adult: Secondary | ICD-10-CM

## 2012-06-17 DIAGNOSIS — K635 Polyp of colon: Secondary | ICD-10-CM

## 2012-06-17 DIAGNOSIS — R7309 Other abnormal glucose: Secondary | ICD-10-CM

## 2012-06-17 DIAGNOSIS — K219 Gastro-esophageal reflux disease without esophagitis: Secondary | ICD-10-CM

## 2012-06-17 DIAGNOSIS — E282 Polycystic ovarian syndrome: Secondary | ICD-10-CM

## 2012-06-17 DIAGNOSIS — R739 Hyperglycemia, unspecified: Secondary | ICD-10-CM

## 2012-06-17 DIAGNOSIS — I10 Essential (primary) hypertension: Secondary | ICD-10-CM

## 2012-06-17 DIAGNOSIS — D126 Benign neoplasm of colon, unspecified: Secondary | ICD-10-CM

## 2012-06-17 LAB — POCT GLYCOSYLATED HEMOGLOBIN (HGB A1C): Hemoglobin A1C: 6.4

## 2012-06-17 MED ORDER — OMEPRAZOLE 20 MG PO CPDR: 20.0000 mg | DELAYED_RELEASE_CAPSULE | Freq: Every day | ORAL | Status: DC

## 2012-06-17 MED ORDER — LISINOPRIL-HYDROCHLOROTHIAZIDE 20-25 MG PO TABS: 1.0000 | ORAL_TABLET | Freq: Every day | ORAL | Status: DC

## 2012-06-17 MED ORDER — METFORMIN HCL 500 MG PO TABS: 500.0000 mg | ORAL_TABLET | Freq: Two times a day (BID) | ORAL | Status: DC

## 2012-06-17 NOTE — Assessment & Plan Note (Signed)
Increase of 3 lb in past 7 months, even if it does not seem significant pt is now 290 lb. 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 After discussion Pt is interested in weight management. Plan: Metformin can help but pt needs major life style modification. Goal for now is not to increase more weight and focuse in one life-style change at the time. Referral to dietitian will be beneficiary.

## 2012-06-17 NOTE — Assessment & Plan Note (Addendum)
Controlled on Omeprazole 20 mg daily. Pt follows also with GI.

## 2012-06-17 NOTE — Progress Notes (Signed)
  Subjective:    Patient ID: Kristin Lawrence, female    DOB: 01-Mar-1976, 36 y.o.   MRN: [id number]  HPI This is my first time seen Kristin Lawrence who comes today for her annual visit. She has hx of HTN, Obesity and PCOS (diagnosticated in an GYN office here in [place] in the past). She has no complaints today and initially was not interested in discuss her obesity. As visit was developing she was very attentive to her diagnosis of PCOS and the possibility of other conditions as part of the syndrome. Now more interested  in weight management. She also comes for refills of her BP medications. Pt denies chest pain, palpitations, vision problems or HA. No SOB, orthopnea or LE edema. Also negative ROS for weakness, dizziness or numbness. She is compliant with  HCTZ /lisinopril and denies side effects.  Review of Systems Per HPI    Objective:   Physical Exam Gen:  Morbidly obese pt NAD HEENT: Moist mucous membranes. Neck supple no JVD or adenopathies. Thyroid non palpable due to pt's habitus. CV: Regular rate and rhythm, no murmurs. PULM: Clear to auscultation bilaterally. ABD: Soft, non tender, normal bowel sounds EXT: No edema Neuro: Alert and oriented x3. No focalization     Assessment & Plan:

## 2012-06-17 NOTE — Assessment & Plan Note (Signed)
Pt mentioned the diagnosis was made with U/S and blood tests after having very bad acne and obesity. She was not very knowledgeable about the spectrum of her condition. She has not have any symptoms of hyper or hypoglycemia. No abdominal pain.  Plan: DM screening since pt has been on 100's glucose. Last lipid profile was 1 year ago with mild elevation of LDL. Possible will repeat Lipid profile next visit. Discussed the benefits of Metformin. Pt interested in therapy since this may help with weight management.

## 2012-06-17 NOTE — Assessment & Plan Note (Signed)
Controlled on HCTZ and Lisinopril. Pt states that her BP oscillates around 120's over 80's and that 130's are rare. Plan: Continue treatment F/u in 3 months.

## 2012-06-17 NOTE — Patient Instructions (Addendum)
Polycystic Ovarian Syndrome Polycystic ovarian syndrome is a condition with a number of problems. One problem is with the ovaries. The ovaries are organs located in the female pelvis, on each side of the uterus. Usually, during the menstrual cycle, an egg is released from 1 ovary every month. This is called ovulation. When the egg is fertilized, it goes into the womb (uterus), which allows for the growth of a baby. The egg travels from the ovary through the fallopian tube to the uterus. The ovaries also make the hormones estrogen and progesterone. These hormones help the development of a woman's breasts, body shape, and body hair. They also regulate the menstrual cycle and pregnancy. Sometimes, cysts form in the ovaries. A cyst is a fluid-filled sac. On the ovary, different types of cysts can form. The most common type of ovarian cyst is called a functional or ovulation cyst. It is normal, and often forms during the normal menstrual cycle. Each month, a woman's ovaries grow tiny cysts that hold the eggs. When an egg is fully grown, the sac breaks open. This releases the egg. Then, the sac which released the egg from the ovary dissolves. In one type of functional cyst, called a follicle cyst, the sac does not break open to release the egg. It may actually continue to grow. This type of cyst usually disappears within 1 to 3 months.  One type of cyst problem with the ovaries is called Polycystic Ovarian Syndrome (PCOS). In this condition, many follicle cysts form, but do not rupture and produce an egg. This health problem can affect the following:  Menstrual cycle.   Heart.   Obesity.   Cancer of the uterus.   Fertility.   Blood vessels.   Hair growth (face and body) or baldness.   Hormones.   Appearance.   High blood pressure.   Stroke.   Insulin production.   Inflammation of the liver.   Elevated blood cholesterol and triglycerides.  CAUSES   No one knows the exact cause of PCOS.    Women with PCOS often have a mother or sister with PCOS. There is not yet enough proof to say this is inherited.   Many women with PCOS have a weight problem.   Researchers are looking at the relationship between PCOS and the body's ability to make insulin. Insulin is a hormone that regulates the change of sugar, starches, and other food into energy for the body's use, or for storage. Some women with PCOS make too much insulin. It is possible that the ovaries react by making too many female hormones, called androgens. This can lead to acne, excessive hair growth, weight gain, and ovulation problems.   Too much production of luteinizing hormone (LH) from the pituitary gland in the brain stimulates the ovary to produce too much female hormone (androgen).  SYMPTOMS   Infrequent or no menstrual periods, and/or irregular bleeding.   Inability to get pregnant (infertility), because of not ovulating.   Increased growth of hair on the face, chest, stomach, back, thumbs, thighs, or toes.   Acne, oily skin, or dandruff.   Pelvic pain.   Weight gain or obesity, usually carrying extra weight around the waist.   Type 2 diabetes (this is the diabetes that usually does not need insulin).   High cholesterol.   High blood pressure.   Female-pattern baldness or thinning hair.   Patches of thickened and dark brown or black skin on the neck, arms, breasts, or thighs.   Skin tags,   or tiny excess flaps of skin, in the armpits or neck area.   Sleep apnea (excessive snoring and breathing stops at times while asleep).   Deepening of the voice.   Gestational diabetes when pregnant.   Increased risk of miscarriage with pregnancy.  DIAGNOSIS  There is no single test to diagnose PCOS.   Your caregiver will:   Take a medical history.   Perform a pelvic exam.   Perform an ultrasound.   Check your female and female hormone levels.   Measure glucose or sugar levels in the blood.   Do other blood  tests.   If you are producing too many female hormones, your caregiver will make sure it is from PCOS. At the physical exam, your caregiver will want to evaluate the areas of increased hair growth. Try to allow natural hair growth for a few days before the visit.   During a pelvic exam, the ovaries may be enlarged or swollen by the increased number of small cysts. This can be seen more easily by vaginal ultrasound or screening, to examine the ovaries and lining of the uterus (endometrium) for cysts. The uterine lining may become thicker, if there has not been a regular period.  TREATMENT  Because there is no cure for PCOS, it needs to be managed to prevent problems. Treatments are based on your symptoms. Treatment is also based on whether you want to have a baby or whether you need contraception.  Treatment may include:  Progesterone hormone, to start a menstrual period.   Birth control pills, to make you have regular menstrual periods.   Medicines to make you ovulate, if you want to get pregnant.   Medicines to control your insulin.   Medicine to control your blood pressure.   Medicine and diet, to control your high cholesterol and triglycerides in your blood.   Surgery, making small holes in the ovary, to decrease the amount of female hormone production. This is done through a long, lighted tube (laparoscope), placed into the pelvis through a tiny incision in the lower abdomen.  Your caregiver will go over some of the choices with you. WOMEN WITH PCOS HAVE THESE CHARACTERISTICS:  High levels of female hormones called androgens.   An irregular or no menstrual cycle.   May have many small cysts in their ovaries.  PCOS is the most common hormonal reproductive problem in women of childbearing age. WHY DO WOMEN WITH PCOS HAVE TROUBLE WITH THEIR MENSTRUAL CYCLE? Each month, about 20 eggs start to mature in the ovaries. As one egg grows and matures, the follicle breaks open to release the egg,  so it can travel through the fallopian tube for fertilization. When the single egg leaves the follicle, ovulation takes place. In women with PCOS, the ovary does not make all of the hormones it needs for any of the eggs to fully mature. They may start to grow and accumulate fluid, but no one egg becomes large enough. Instead, some may remain as cysts. Since no egg matures or is released, ovulation does not occur and the hormone progesterone is not made. Without progesterone, a woman's menstrual cycle is irregular or absent. Also, the cysts produce female hormones, which continue to prevent ovulation.  Document Released: 01/31/2005 Document Revised: 09/26/2011 Document Reviewed: 08/25/2009 [employer] Patient Information 2012 [employer]. 

## 2012-06-17 NOTE — Telephone Encounter (Signed)
Left a message with information about her A1C and the recommendation to start Metformin, as we discussed today, since her results puts her in the group of at risk of developing DM. Metformin has been sent to pharmacy. She should start on 500mg  daily for 2 weeks and the increase to 500mg  twice a day and stay in that dose until next appointment in 3 months. Important to take medications with food.

## 2012-09-22 ENCOUNTER — Telehealth: Payer: Self-pay | Admitting: Family Medicine

## 2012-09-22 ENCOUNTER — Ambulatory Visit (INDEPENDENT_AMBULATORY_CARE_PROVIDER_SITE_OTHER): Payer: 59 | Admitting: Family Medicine

## 2012-09-22 ENCOUNTER — Encounter: Payer: Self-pay | Admitting: Family Medicine

## 2012-09-22 VITALS — BP 129/86 | HR 75 | Temp 98.3°F | Ht 66.0 in | Wt 291.0 lb

## 2012-09-22 DIAGNOSIS — R220 Localized swelling, mass and lump, head: Secondary | ICD-10-CM

## 2012-09-22 DIAGNOSIS — K1370 Unspecified lesions of oral mucosa: Secondary | ICD-10-CM

## 2012-09-22 DIAGNOSIS — K1379 Other lesions of oral mucosa: Secondary | ICD-10-CM

## 2012-09-22 DIAGNOSIS — R221 Localized swelling, mass and lump, neck: Secondary | ICD-10-CM

## 2012-09-22 DIAGNOSIS — M8588 Other specified disorders of bone density and structure, other site: Secondary | ICD-10-CM

## 2012-09-22 LAB — CBC WITH DIFFERENTIAL/PLATELET
Basophils Absolute: 0 10*3/uL (ref 0.0–0.1)
Basophils Relative: 0 % (ref 0–1)
Eosinophils Absolute: 0.1 10*3/uL (ref 0.0–0.7)
MCH: 30.1 pg (ref 26.0–34.0)
MCHC: 35.7 g/dL (ref 30.0–36.0)
Neutro Abs: 2.8 10*3/uL (ref 1.7–7.7)
Neutrophils Relative %: 38 % — ABNORMAL LOW (ref 43–77)
Platelets: 414 10*3/uL — ABNORMAL HIGH (ref 150–400)

## 2012-09-22 MED ORDER — AMOXICILLIN-POT CLAVULANATE 875-125 MG PO TABS: 1.0000 | ORAL_TABLET | Freq: Two times a day (BID) | ORAL | Status: DC

## 2012-09-22 MED ORDER — OXYCODONE-ACETAMINOPHEN 5-325 MG PO TABS: 1.0000 | ORAL_TABLET | Freq: Four times a day (QID) | ORAL | Status: DC | PRN

## 2012-09-22 MED ORDER — KETOROLAC TROMETHAMINE 30 MG/ML IJ SOLN
30.0000 mg | Freq: Once | INTRAMUSCULAR | Status: AC
  Administered 2012-09-22: 15 mg via INTRAMUSCULAR

## 2012-09-22 NOTE — Assessment & Plan Note (Signed)
Possible abscess and difficult to exclude given incomplete exam. No systemic signs such as fever, nausea, vomiting. Will get CBC to evaluate for infectious etiology. No signs of airway compromise. No tooth source identified. Will treat with Augmentin x 10 days until evaluation by ENT. Have scheduled patient appointment with Dr. Pollyann Kennedy of ENT tomorrow at 9:50 AM due to history of sinus surgery and patient reported similar presentation at that time. Pain controlled with toradol in office along with Percocet 1-2 tabs 5/325 q6 hours.

## 2012-09-22 NOTE — Progress Notes (Signed)
Subjective:   1. Mouth pain-patient complains of a "cyst" in the roof of her mouth similar to one she had in 2004 when she had surgery (see below). She is complaining of progressive size and pain of the lesion. 10/10 dull constant pain. Radiates to right side of her face with no numbness tingling. No tongue swelling, SOB, fever, nausea, vomiting. Has had decreased PO as it is difficult for her to chew. She is unable to sleep due to pain. Almost went to ED today before got same day appointment.   ROS--See HPI  Past Surgical History: Patient describes a sinus surgery on left side with a  Similar presentation with ENT in 2004. States she was cared [facility] at [facility] but [facility] adviser in [place] but cannot recall name.  Reviewed problem list.  Medications- reviewed and updated Chief complaint-noted  Objective: BP 129/86  Pulse 75  Temp 98.3 F (36.8 C) (Oral)  Ht 5\' 6"  (1.676 m)  Wt 291 lb (131.997 kg)  BMI 46.97 kg/m2 Gen: patient visibly tearful upon entering the room, crying each time I reenter room HEENT: 2cm in diameter x 1 cm in depth circular mass on roof of hard palate just to right of midlinge. Extremely tender to palpation preventing complete evaluation (patient pulls away during exam even when distracted). Bilateral mucosa in nares appear slightly edematous. PERRLA. Face intact to light touch. EOMI. Good dentition CV: RRR no mrg Lungs: CTAB  Assessment/Plan: See problem oriented charted

## 2012-09-22 NOTE — Telephone Encounter (Signed)
Patient is calling because she has a bump on the roof of her mouth that is very sore.  A few years ago, she had something similar and she had to go to an ENT and a surgeon to have it removed.  She doesn't remember who the ENT was and she doesn't know if she will need a referral so she would like to speak to the nurse to help her determine what she needs to do next.

## 2012-09-22 NOTE — Patient Instructions (Signed)
I am concerned about the bump on the roof of your mouth.  I am giving you pain medicine and an antibiotic (the antibiotic is in case it is an infection although I do not think it is).  I want you to go to [facility] with Dr. Pollyann Kennedy 12/4 at 9:50 AM. Arrive 15 minutes early and current medications and any copay. [address]

## 2012-09-22 NOTE — Telephone Encounter (Signed)
Patient came in today for visit with Dr. Durene Cal

## 2012-09-23 ENCOUNTER — Ambulatory Visit
Admission: RE | Admit: 2012-09-23 | Discharge: 2012-09-23 | Disposition: A | Discharge status: Home / Self Care | Payer: 59 | Source: Ambulatory Visit | Attending: Otolaryngology | Admitting: Otolaryngology

## 2012-09-23 ENCOUNTER — Other Ambulatory Visit: Payer: Self-pay | Admitting: Otolaryngology

## 2012-09-23 DIAGNOSIS — M2749 Other cysts of jaw: Secondary | ICD-10-CM

## 2012-10-18 ENCOUNTER — Other Ambulatory Visit: Payer: Self-pay | Admitting: Family Medicine

## 2013-03-08 ENCOUNTER — Encounter (HOSPITAL_COMMUNITY): Payer: Self-pay | Admitting: Emergency Medicine

## 2013-03-08 ENCOUNTER — Emergency Department (HOSPITAL_COMMUNITY): Payer: BC Managed Care – PPO

## 2013-03-08 ENCOUNTER — Emergency Department (HOSPITAL_COMMUNITY)
Admission: EM | Admit: 2013-03-08 | Discharge: 2013-03-08 | Disposition: A | Discharge status: Home / Self Care | Payer: BC Managed Care – PPO | Attending: Emergency Medicine | Admitting: Emergency Medicine

## 2013-03-08 DIAGNOSIS — IMO0001 Reserved for inherently not codable concepts without codable children: Secondary | ICD-10-CM | POA: Insufficient documentation

## 2013-03-08 DIAGNOSIS — F411 Generalized anxiety disorder: Secondary | ICD-10-CM | POA: Insufficient documentation

## 2013-03-08 DIAGNOSIS — Z862 Personal history of diseases of the blood and blood-forming organs and certain disorders involving the immune mechanism: Secondary | ICD-10-CM | POA: Insufficient documentation

## 2013-03-08 DIAGNOSIS — R42 Dizziness and giddiness: Secondary | ICD-10-CM

## 2013-03-08 DIAGNOSIS — I10 Essential (primary) hypertension: Secondary | ICD-10-CM

## 2013-03-08 DIAGNOSIS — M79602 Pain in left arm: Secondary | ICD-10-CM

## 2013-03-08 DIAGNOSIS — Z8719 Personal history of other diseases of the digestive system: Secondary | ICD-10-CM | POA: Insufficient documentation

## 2013-03-08 DIAGNOSIS — R209 Unspecified disturbances of skin sensation: Secondary | ICD-10-CM | POA: Insufficient documentation

## 2013-03-08 DIAGNOSIS — E669 Obesity, unspecified: Secondary | ICD-10-CM | POA: Insufficient documentation

## 2013-03-08 DIAGNOSIS — H538 Other visual disturbances: Secondary | ICD-10-CM | POA: Insufficient documentation

## 2013-03-08 DIAGNOSIS — Z8669 Personal history of other diseases of the nervous system and sense organs: Secondary | ICD-10-CM | POA: Insufficient documentation

## 2013-03-08 DIAGNOSIS — M79609 Pain in unspecified limb: Secondary | ICD-10-CM | POA: Insufficient documentation

## 2013-03-08 DIAGNOSIS — N643 Galactorrhea not associated with childbirth: Secondary | ICD-10-CM

## 2013-03-08 DIAGNOSIS — D1803 Hemangioma of intra-abdominal structures: Secondary | ICD-10-CM

## 2013-03-08 DIAGNOSIS — D1809 Hemangioma of other sites: Secondary | ICD-10-CM | POA: Insufficient documentation

## 2013-03-08 DIAGNOSIS — R110 Nausea: Secondary | ICD-10-CM | POA: Insufficient documentation

## 2013-03-08 DIAGNOSIS — K219 Gastro-esophageal reflux disease without esophagitis: Secondary | ICD-10-CM

## 2013-03-08 DIAGNOSIS — Z87891 Personal history of nicotine dependence: Secondary | ICD-10-CM | POA: Insufficient documentation

## 2013-03-08 LAB — COMPREHENSIVE METABOLIC PANEL
ALT: 34 U/L (ref 0–35)
Albumin: 4.1 g/dL (ref 3.5–5.2)
Alkaline Phosphatase: 83 U/L (ref 39–117)
GFR calc Af Amer: >90 mL/min (ref >90)
Glucose, Bld: 107 mg/dL — ABNORMAL HIGH (ref 70–99)
Potassium: 4.3 mEq/L (ref 3.5–5.1)
Sodium: 139 mEq/L (ref 135–145)
Total Protein: 8.8 g/dL — ABNORMAL HIGH (ref 6.0–8.3)

## 2013-03-08 LAB — CBC WITH DIFFERENTIAL/PLATELET
Eosinophils Absolute: 0.2 10*3/uL (ref 0.0–0.7)
Lymphs Abs: 3 10*3/uL (ref 0.7–4.0)
MCH: 30.6 pg (ref 26.0–34.0)
Neutrophils Relative %: 53 % (ref 43–77)
Platelets: 372 10*3/uL (ref 150–400)
RBC: 4.21 MIL/uL (ref 3.87–5.11)
WBC: 7.9 10*3/uL (ref 4.0–10.5)

## 2013-03-08 LAB — POCT I-STAT TROPONIN I
Troponin i, poc: 0 ng/mL (ref 0.00–0.08)
Troponin i, poc: 0 ng/mL (ref 0.00–0.08)

## 2013-03-08 NOTE — ED Notes (Signed)
Was at work and her left  Arm got red and  Tingling  and throbbing  No injury can wiggle fingers and has good pulse and blanch

## 2013-03-08 NOTE — ED Notes (Signed)
Pt c/o numbness and tingling and throbbing of left arm from hand to shoulder. Pt has equal grip, full movement of extremity no drift noted. Pt sts having multiple similar episodes in the past that have resolved within an hour. sts this episode started at 11am today and has not resolved.

## 2013-03-08 NOTE — ED Notes (Signed)
Blurry vision nausea and dizzy

## 2013-03-08 NOTE — ED Notes (Signed)
Dr Juleen China into see pt and examine

## 2013-03-08 NOTE — ED Notes (Signed)
Pt discharged.Vital signs stable and GCS 15 

## 2013-03-08 NOTE — ED Notes (Signed)
States this is not the first time this has happened but it went away like in less than an hour  And tody has been going on sijnce 11

## 2013-03-08 NOTE — ED Provider Notes (Signed)
History     CSN: [id number]  Arrival date & time 03/08/13  1430   First MD Initiated Contact with Patient 03/08/13 1537      Chief Complaint  Patient presents with  . Arm Pain  . Dizziness    (Consider location/radiation/quality/duration/timing/severity/associated sxs/prior treatment) HPI Comments: Kristin Lawrence is a 37 y/o F with PMHx of H. Pylori, liver hemangioma, galactorrhea, benign HTN, GERD, IBS, anxiety, anemia presenting to the ED with left arm pain. Patient stated that she woke up fine this morning. Stated that around 11:00AM this morning she had sudden on set of left arm numbness and tingling that lasted approximately 10 minutes which then turned into left arm pain described as a throbbing sensation that has not let up since then. Patient stated that it feels as if her left arm is "in a vice." patient stated that she had associated symptoms of nausea, dizziness, blurred vision, and felt like she was going to pass out. She stated that the nausea lasted for approximately 15 minutes, while dizziness and blurred vision to both eyes lasted approximately 30-45 minutes. Patient reported that when she looked down at her arm she had mild swelling and redness to the skin - patient reported that upon evaluation the skin is coming back to normal color. Patient stated that she had abdominal pain x 1 week ago for one day - in a "horseshoe" presentation as per patient, upper quadrants and sides - denied pain today. Patient reported mild neck stiffness for one day x 1 week ago - stated that she is fine and stated that there is no pain in her neck. Denied loss of vision, weakness, LOC, syncope, fall, trauma, injury, neck stiffness, fever, chills, vomiting, diarrhea, speech difficulty, calf pain, chest pain, shortness of breathe, difficulty breathing.  Patient reported that she has been having these episodes of left arm pain at least once a month for the past couple of years. Patient stated that she has not  discussed this with her PCP because she has not thought anything of it. Patient stated that the reason why she came to the ED today is because the symptoms lasted longer than normal - usually all go away within a couple of minutes.   The history is provided by the patient. No language interpreter was used.    Past Medical History  Diagnosis Date  . Helicobacter pylori ab+ 12/11/09    Per Dr Leonie Green note  . Liver hemangioma   . Galactorrhea   . Benign hypertension   . GERD (gastroesophageal reflux disease)   . IBS (irritable bowel syndrome)   . Obesity   . Anemia   . Anxiety   . Sleep apnea     Past Surgical History  Procedure Laterality Date  . Tubal ligation    . Nasal sinus surgery      Family History  Problem Relation Age of Onset  . Heart disease Mother 76    MI?  Marland Kitchen Colon cancer Mother 2  . Stroke Maternal Uncle   . Stroke Paternal Uncle   . Pancreatic cancer Maternal Grandmother   . Diabetes Maternal Grandmother   . Esophageal cancer Maternal Uncle   . Liver cancer Maternal Uncle   . Stomach cancer Paternal Grandfather   . Cystic fibrosis Mother     History  Substance Use Topics  . Smoking status: Former Smoker    Types: Cigarettes    Quit date: 03/20/1997  . Smokeless tobacco: Never Used  . Alcohol Use: 8.4  oz/week    14 Shots of liquor per week     Comment: 3-4 daily     OB History   Grav Para Term Preterm Abortions TAB SAB Ect Mult Living                  Review of Systems  Constitutional: Negative for fever, chills, diaphoresis and fatigue.  HENT: Negative for ear pain, congestion, sore throat, rhinorrhea, sneezing, trouble swallowing, neck pain and neck stiffness.   Eyes: Positive for visual disturbance (blurred vision). Negative for photophobia and pain.  Gastrointestinal: Positive for nausea. Negative for vomiting, abdominal pain, diarrhea, constipation and anal bleeding.  Genitourinary: Negative for dysuria, urgency, hematuria, flank pain,  decreased urine volume and difficulty urinating.  Musculoskeletal: Positive for myalgias (left arm pain). Negative for back pain and arthralgias.  Neurological: Positive for dizziness. Negative for facial asymmetry, speech difficulty, weakness, light-headedness, numbness and headaches.  All other systems reviewed and are negative.    Allergies  Review of patient's allergies indicates no known allergies.  Home Medications   Current Outpatient Rx  Name  Route  Sig  Dispense  Refill  . acetaminophen (TYLENOL) 500 MG tablet   Oral   Take 1,500 mg by mouth daily as needed for pain.         Marland Kitchen lisinopril-hydrochlorothiazide (PRINZIDE,ZESTORETIC) 20-25 MG per tablet   Oral   Take 1 tablet by mouth daily.         . Multiple Vitamins-Minerals (ADULT ONE DAILY GUMMIES) CHEW   Oral   Chew 2 each by mouth daily.         . pantoprazole (PROTONIX) 20 MG tablet   Oral   Take 20 mg by mouth every morning.           BP 111/62  Pulse 73  Temp(Src) 98.3 F (36.8 C) (Oral)  Resp 16  SpO2 98%  LMP 03/08/2013  Physical Exam  Nursing note and vitals reviewed. Constitutional: She is oriented to person, place, and time. She appears well-developed and well-nourished. No distress.  HENT:  Head: Normocephalic and atraumatic.  Mouth/Throat: Oropharynx is clear and moist. No oropharyngeal exudate.  Uvula midline, symmetrical elevation  Eyes: Conjunctivae and EOM are normal. Pupils are equal, round, and reactive to light. Right eye exhibits no discharge.  Neck: Normal range of motion. Neck supple. No tracheal deviation present. No thyromegaly present.  Negative lymphadenopathy Negative neck stiffness Negative nuchal rigidity   Cardiovascular: Normal rate, regular rhythm and normal heart sounds.  Exam reveals no friction rub.   No murmur heard. Radial pulses 2+ bilaterally Pedal pulses 2+ bilaterally Negative leg and ankle swelling Negative pitting edema noted  Pulmonary/Chest:  Effort normal and breath sounds normal. No respiratory distress. She has no wheezes. She has no rales.  Abdominal: Soft. Bowel sounds are normal. She exhibits no distension and no mass. There is no tenderness. There is no rebound and no guarding.  Musculoskeletal: Normal range of motion. She exhibits tenderness. She exhibits no edema.       Left shoulder: She exhibits tenderness. She exhibits normal range of motion, no bony tenderness, no swelling, no effusion, no crepitus, no deformity, no laceration and normal strength.       Left elbow: She exhibits normal range of motion, no effusion, no deformity and no laceration. Tenderness found. Olecranon process tenderness noted.       Left wrist: She exhibits tenderness. She exhibits normal range of motion, no bony tenderness, no swelling, no effusion,  no deformity and no laceration.       Arms: Full ROM to upper and lower extremities bilaterally Strength 5+/5+ to upper and lower extremities bilaterally  Lymphadenopathy:    She has no cervical adenopathy.  Neurological: She is alert and oriented to person, place, and time. No cranial nerve deficit. She exhibits normal muscle tone. Coordination normal.  Cranial nerves III-XII grossly intact Sensation intact to upper and lower extremities bilaterally with differentiation to sharp and dull sensation  Skin: Skin is warm and dry. No rash noted. She is not diaphoretic. No erythema.  Psychiatric: She has a normal mood and affect. Her behavior is normal. Thought content normal.    ED Course  Procedures (including critical care time)   Date: 03/08/2013  Rate: 78  Rhythm: normal sinus rhythm and sinus arrhythmia  QRS Axis: normal  Intervals: normal  ST/T Wave abnormalities: nonspecific T wave changes  Conduction Disutrbances:none  Narrative Interpretation:   Old EKG Reviewed: none available    Labs Reviewed  COMPREHENSIVE METABOLIC PANEL - Abnormal; Notable for the following:    Glucose, Bld 107  (*)    BUN 5 (*)    Total Protein 8.8 (*)    AST 58 (*)    Total Bilirubin 0.2 (*)    All other components within normal limits  CBC WITH DIFFERENTIAL   Ct Head Wo Contrast  03/08/2013   *RADIOLOGY REPORT*  Clinical Data: The left upper extremity numbness and tingling  CT HEAD WITHOUT CONTRAST  Technique:  Contiguous axial images were obtained from the base of the skull through the vertex without contrast. Study was obtained within 24 hours of patient arrival at the emergency department.  Comparison: None.  Findings: The ventricles are normal in size and configuration. There is no mass, hemorrhage, extra-axial fluid collection, or midline shift.  No focal gray-white compartment lesions are identified.  There is no demonstrable acute infarct.  Bony calvarium appears intact.  The mastoid air cells are clear.  IMPRESSION: Study within normal limits.   Original Report Authenticated By: Bretta Bang, M.D.     1. Arm pain, left   2. Dizziness   3. Liver hemangioma   4. Galactorrhea   5. GERD (gastroesophageal reflux disease)   6. Benign essential HTN       MDM  Patient afebrile, non-tachycardic, non-tachypneic, normotensive, adequate saturation on room air.  Patient presenting with left arm pain that is now throbbing upon evaluation. Negative erythema, swelling, inflammation, lesions, sores noted to left arm. Discomfort upon palpation to the left arm throughout. Full ROM without pain. No neurovascular damage noted. No neurological deficit noted.  Denied pain medications.  Labs and imaging ordered  CBC negative findings CMP negative findings - elevated AST (58) - stated that she drinks alcohol CT head negative findings EKG negative STEMI/NSTEMI Troponins negative  Discussed case and lab findings with Dr. Leeann Must - unknown etiology of left arm pain, cleared patient for discharge. Discussed with patient the lab findings. Negative neurological deficits. Negative visual acuity findings  - stated that blurry vision lasted a short period of time. Negative erythema, swelling, inflammation, warmth to touch noted to left arm - less likely cellulitis. Negative fever, neck stiffness, nuchal rigidity, nausea, vomiting - less likely meningitis. Patient aseptic, non-toxic appearing, in no acute distress. Arm pain mildly improved, negative dizziness. Patient discharged. Presenting with left arm pain with unknown etiology. Referred patient to PCP and neurology - discussed with patient to follow-up with PCP and neurology by the  end of this week. Discussed with patient to bring up episodes of ongoing arm pain every month with PCP. Discussed with patient to decrease alcohol intake and to have liver enzymes re-checked by PCP. Discussed with patient to continue to take medications as prescribed. Discussed with patient to stay hydrated and to rest. Discussed with patient to refrain from doing strenuous activity. Discussed with patient to continue to monitor symptoms and if symptoms are to worsen or change to report back to the ED. Patient agreed to plan of care, understood, all questions answered.         Raymon Mutton, PA-C 03/08/13 2113

## 2013-03-08 NOTE — ED Notes (Signed)
Weight:   299.9

## 2013-03-09 NOTE — ED Provider Notes (Signed)
Medical screening examination/treatment/procedure(s) were conducted as a shared visit with non-physician practitioner(s) and myself.  I personally evaluated the patient during the encounter Pt c/o feeling sl dizzy earlier. Denies room spinning/vertigo. States sl lightheaded, but denies near syncope/syncope. No palpitations or sense of rapid or irregular heartbeat. No chest pain. States arm transiently tingly feeling. Notes similar episode monthly at rest. No hx syncope or dysrhythmia. Pt currently asymptomatic. Rrr. abd soft nt. Ecg. Labs.   Suzi Roots, MD 03/09/13 [phone]

## 2013-04-06 ENCOUNTER — Ambulatory Visit: Payer: BC Managed Care – PPO

## 2013-06-18 ENCOUNTER — Other Ambulatory Visit: Payer: Self-pay | Admitting: *Deleted

## 2013-06-18 MED ORDER — LISINOPRIL-HYDROCHLOROTHIAZIDE 20-25 MG PO TABS: 1.0000 | ORAL_TABLET | Freq: Every day | ORAL | Status: DC

## 2014-01-19 ENCOUNTER — Telehealth: Payer: Self-pay | Admitting: Family Medicine

## 2014-01-19 NOTE — Telephone Encounter (Signed)
Pt called and needs a refill on her Lisinopril sent in. She took her last pill today. jw

## 2014-01-20 ENCOUNTER — Other Ambulatory Visit: Payer: Self-pay | Admitting: Family Medicine

## 2014-01-20 NOTE — Telephone Encounter (Signed)
Prescription sent to pharmacy.

## 2014-01-24 ENCOUNTER — Other Ambulatory Visit: Payer: Self-pay | Admitting: *Deleted

## 2014-01-24 NOTE — Telephone Encounter (Signed)
Refill request per provider was filled, but nothing in chart that shows updated.  Patient called pharmacy on Friday and rx was not there.  Patient have been out of medication for six days and now have swelling to lower extremities.  Please call her when rx has been sent in

## 2014-01-24 NOTE — Telephone Encounter (Signed)
Relayed message,patient voiced understanding. Lovinia Snare S  

## 2014-01-25 ENCOUNTER — Ambulatory Visit (INDEPENDENT_AMBULATORY_CARE_PROVIDER_SITE_OTHER): Payer: BC Managed Care – PPO | Admitting: Family Medicine

## 2014-01-25 ENCOUNTER — Encounter: Payer: Self-pay | Admitting: Family Medicine

## 2014-01-25 VITALS — BP 152/98 | HR 92 | Temp 98.5°F | Wt 301.0 lb

## 2014-01-25 DIAGNOSIS — I10 Essential (primary) hypertension: Secondary | ICD-10-CM

## 2014-01-25 LAB — CBC
HCT: 33.2 % — ABNORMAL LOW (ref 36.0–46.0)
HEMOGLOBIN: 11.5 g/dL — AB (ref 12.0–15.0)
MCH: 29.1 pg (ref 26.0–34.0)
MCHC: 34.6 g/dL (ref 30.0–36.0)
MCV: 84.1 fL (ref 78.0–100.0)
Platelets: 366 10*3/uL (ref 150–400)
RBC: 3.95 MIL/uL (ref 3.87–5.11)
RDW: 13.9 % (ref 11.5–15.5)
WBC: 7.5 10*3/uL (ref 4.0–10.5)

## 2014-01-25 MED ORDER — LISINOPRIL-HYDROCHLOROTHIAZIDE 20-25 MG PO TABS: 1.0000 | ORAL_TABLET | Freq: Every day | ORAL | Status: DC

## 2014-01-25 NOTE — Progress Notes (Signed)
   Subjective:    Patient ID: Kristin Lawrence, female    DOB: 11/06/1975, 38 y.o.   MRN: [id number]  HPI 38 yo F with chronic HTN presents for medication refill.   CHRONIC HYPERTENSION  Disease Monitoring  Blood pressure range: 120-130s   Chest pain: no   Dyspnea: no   Claudication: no   Edema: yes   Medication compliance: yes, but out of meds x 7 days   Medication Side Effects  Lightheadedness: no   Urinary frequency: no   Edema: no   Impotence: no   Soc Hx: ex-smoker  Review of Systems As per HPI  Mild HA with facial tingling yesteday    Objective:   Physical Exam BP 152/98  Pulse 92  Temp(Src) 98.5 F (36.9 C) (Oral)  Wt 301 lb (136.533 kg)  LMP 01/15/2014 General appearance: alert, cooperative and no distress Lungs: clear to auscultation bilaterally Heart: regular rate and rhythm, S1, S2 normal, no murmur, click, rub or gallop Extremities: edema trace b/l  Neurologic: Grossly normal  Labs today: CBC, CMP, lipid panel (fasting)      Assessment & Plan:

## 2014-01-25 NOTE — Patient Instructions (Signed)
Kristin Lawrence,  Thank you for coming in today. I have refilled your BP medication.  I do recommend that you come back for f/u visit with Dr. Rebbeca Paul in 2-3 weeks to make sure your BP is back to normal.   I will be in touch with lab results.  Dr. Adrian Blackwater

## 2014-01-25 NOTE — Assessment & Plan Note (Signed)
A: normal exam. BP elevated in the setting of medication non compliance.  Med: ran out 7 days ago.  P:  Refilled prinzide. Labs F/u recommended in 2-3 weeks with PCP.

## 2014-01-25 NOTE — Telephone Encounter (Signed)
Left voice message for pt to check with her pharmacy to check if Lisinopril has been filled.  Derl Barrow, RN

## 2014-01-26 ENCOUNTER — Telehealth: Payer: Self-pay | Admitting: Family Medicine

## 2014-01-26 DIAGNOSIS — E876 Hypokalemia: Secondary | ICD-10-CM | POA: Insufficient documentation

## 2014-01-26 LAB — COMPREHENSIVE METABOLIC PANEL
ALBUMIN: 4.2 g/dL (ref 3.5–5.2)
ALT: 23 U/L (ref 0–35)
AST: 26 U/L (ref 0–37)
Alkaline Phosphatase: 82 U/L (ref 39–117)
BUN: 13 mg/dL (ref 6–23)
CALCIUM: 8.7 mg/dL (ref 8.4–10.5)
CHLORIDE: 96 meq/L (ref 96–112)
CO2: 31 meq/L (ref 19–32)
Creat: 0.88 mg/dL (ref 0.50–1.10)
Glucose, Bld: 103 mg/dL — ABNORMAL HIGH (ref 70–99)
POTASSIUM: 3.2 meq/L — AB (ref 3.5–5.3)
SODIUM: 140 meq/L (ref 135–145)
TOTAL PROTEIN: 7.5 g/dL (ref 6.0–8.3)
Total Bilirubin: 0.5 mg/dL (ref 0.2–1.2)

## 2014-01-26 LAB — LIPID PANEL
CHOLESTEROL: 169 mg/dL (ref 0–200)
HDL: 55 mg/dL (ref >39)
LDL Cholesterol: 93 mg/dL (ref 0–99)
Total CHOL/HDL Ratio: 3.1 Ratio
Triglycerides: 106 mg/dL (ref <150)
VLDL: 21 mg/dL (ref 0–40)

## 2014-01-26 MED ORDER — POTASSIUM CHLORIDE CRYS ER 20 MEQ PO TBCR: 40.0000 meq | EXTENDED_RELEASE_TABLET | Freq: Every day | ORAL | Status: DC

## 2014-01-26 NOTE — Telephone Encounter (Signed)
Reviewed. Patient called.  Call completed. Reviewed labs. Low K+ on prinzide. No hx of low K+ Patient eats once daily. Plan to replete K+ and have repeat done at PCP f/u (normal renal function).  Patient agrees with plan and voices understanding.

## 2014-01-26 NOTE — Assessment & Plan Note (Signed)
Patient called.  Call completed. Reviewed labs. Low K+ on prinzide. No hx of low K+ Patient eats once daily. Plan to replete K+ and have repeat done at PCP f/u (normal renal function).  If still low at recheck. Will need to d/c HCTZ.  Patient agrees with plan and voices understanding.

## 2014-01-31 ENCOUNTER — Other Ambulatory Visit: Payer: Self-pay | Admitting: *Deleted

## 2014-01-31 DIAGNOSIS — I10 Essential (primary) hypertension: Secondary | ICD-10-CM

## 2014-01-31 MED ORDER — LISINOPRIL-HYDROCHLOROTHIAZIDE 20-25 MG PO TABS: 1.0000 | ORAL_TABLET | Freq: Every day | ORAL | Status: DC

## 2014-10-10 ENCOUNTER — Other Ambulatory Visit (HOSPITAL_COMMUNITY)
Admission: RE | Admit: 2014-10-10 | Discharge: 2014-10-10 | Disposition: A | Discharge status: Home / Self Care | Payer: BC Managed Care – PPO | Source: Ambulatory Visit | Attending: Family Medicine | Admitting: Family Medicine

## 2014-10-10 ENCOUNTER — Encounter: Payer: Self-pay | Admitting: Family Medicine

## 2014-10-10 ENCOUNTER — Ambulatory Visit (INDEPENDENT_AMBULATORY_CARE_PROVIDER_SITE_OTHER): Payer: BC Managed Care – PPO | Admitting: Family Medicine

## 2014-10-10 VITALS — BP 132/84 | HR 74 | Temp 98.9°F | Ht 66.0 in | Wt 295.0 lb

## 2014-10-10 DIAGNOSIS — R600 Localized edema: Secondary | ICD-10-CM | POA: Insufficient documentation

## 2014-10-10 DIAGNOSIS — Z1151 Encounter for screening for human papillomavirus (HPV): Secondary | ICD-10-CM | POA: Diagnosis present

## 2014-10-10 DIAGNOSIS — I10 Essential (primary) hypertension: Secondary | ICD-10-CM | POA: Diagnosis not present

## 2014-10-10 DIAGNOSIS — E876 Hypokalemia: Secondary | ICD-10-CM

## 2014-10-10 DIAGNOSIS — Z01419 Encounter for gynecological examination (general) (routine) without abnormal findings: Secondary | ICD-10-CM | POA: Diagnosis not present

## 2014-10-10 DIAGNOSIS — K635 Polyp of colon: Secondary | ICD-10-CM

## 2014-10-10 DIAGNOSIS — Z124 Encounter for screening for malignant neoplasm of cervix: Secondary | ICD-10-CM | POA: Diagnosis not present

## 2014-10-10 HISTORY — DX: Localized edema: R60.0

## 2014-10-10 LAB — BASIC METABOLIC PANEL
BUN: 14 mg/dL (ref 6–23)
CALCIUM: 9.3 mg/dL (ref 8.4–10.5)
CHLORIDE: 100 meq/L (ref 96–112)
CO2: 29 meq/L (ref 19–32)
CREATININE: 0.67 mg/dL (ref 0.50–1.10)
GLUCOSE: 111 mg/dL — AB (ref 70–99)
Potassium: 4 mEq/L (ref 3.5–5.3)
Sodium: 140 mEq/L (ref 135–145)

## 2014-10-10 NOTE — Assessment & Plan Note (Addendum)
-   Improved - Initial BP 142/85. Recheck 132/84. At goal <140/90. - Currently on Lisinopril-HCTZ 20-25mg  - Discussed importance of not stopping blood pressure medications on her own. Discussed complications of HTN. - Recommended discontinuing self-treatment with Furosemide. Discussed that it is likely contributing to hypokalemia. - Continue working on diet and exercise. Recommended three meals a day and two snacks.

## 2014-10-10 NOTE — Assessment & Plan Note (Signed)
-   Potassium 3.2 at last visit in 01/2014 - Will recheck BMP today. Will contact if abnormal. - Has tablets of K-dur remaining for use if needed. - Recommended discontinuing self-treatment with furosemide as this is likely contributing.

## 2014-10-10 NOTE — Patient Instructions (Addendum)
Thank you so much for coming to visit today!  We will recheck you potassium and kidney levels today. We will let you know if these come back abnormal.  Furosemide (Lasix) can further damage your kidneys and lower your potassium. We do not recommend taking this medication. I will give you a prescription for medium strength compression stockings. Please keep your feet elevated when seated. Please continue to work on your diet and exercise.  We will do a pap smear today and let you know if the results are abnormal. Your next colonoscopy should be done in 2018 (5 years after your colonoscopy in 2013).  Thanks again! Dr. Gerlean Ren

## 2014-10-10 NOTE — Assessment & Plan Note (Signed)
-   Furosemide not recommended due to hypokalemia and concerns for renal function. Not likely to help long term. - Recommend medium strength compression stockings. Prescription given. - Recommend elevating legs when seated. - Recommend exercise and weight loss. - Denies dyspnea on exertion, orthopnea, and paroxysmal nocturnal dyspnea. HTN increases risk for CHF. - Will continue to monitor

## 2014-10-10 NOTE — Progress Notes (Signed)
Subjective:     Patient ID: Kristin Lawrence, female   DOB: 08-03-1976, 38 y.o.   MRN: [id number]  HPI Kristin Lawrence is a 38yo female presenting today for annual physical exam. - No concerns today  #HTN: - States she checks her blood pressure pretty often at work and it is usually 117/73. - Currently prescribed Prinzide and potassium was noted to be low (3.2) at visit on 01/25/14. Prescribed 40mEq K-dur at that time.  She is not currently taking K-Dur, but states she has some left if her potassium is still low today. - States she has been taking a family member's furosemide and she would like a prescription for this if possible. She has been taking it for months (including at last visit when potassium was low) and she ran out about 4 weeks ago.  - States she takes this medication for lower extremity edema. States edema improved with furosemide, however it has not worsened since stopping medication 4 weeks ago. - Denies dyspnea on exertion, chest pain, paroxysmal nocturnal dyspnea, and orthopnea. - Goal is to come off of blood pressure medicine. States she has not quite lost ten pounds yet. She has cut out sugary drinks from her diet. Eats three meals a day. Does not exercise, but states she is constantly walking during the day with her job.  #Health Maintenance - Last pap smear in 02/2011 was negative for intraepithelial lesions or malignancy. HPV not done at that time. - Family history of colon cancer in mom, grandma, and sister. Last colonoscopy in 2013 showed noncancerous polyp. Next colonoscopy due in 2018. - Family history of breast cancer in grandma and great aunt. Would like to start mammograms at age of 24.  Review of Systems  Respiratory: Negative for shortness of breath.   Cardiovascular: Positive for leg swelling. Negative for chest pain.   Social History Reviewed.    Objective:   Physical Exam  Constitutional: She appears well-developed and well-nourished. No distress.  HENT:  Head:  Normocephalic and atraumatic.  Cardiovascular: Normal rate and regular rhythm.  Exam reveals no gallop and no friction rub.   No murmur heard. Pulmonary/Chest: Effort normal and breath sounds normal. No respiratory distress. She has no wheezes. She has no rales.  Abdominal: Soft. She exhibits no distension. There is no tenderness.  Genitourinary: There is no rash, tenderness or lesion on the right labia. There is no rash, tenderness or lesion on the left labia. Cervix exhibits no motion tenderness, no discharge and no friability. Right adnexum displays no mass and no tenderness. Left adnexum displays no mass and no tenderness.  Musculoskeletal: She exhibits edema.  Skin: Skin is warm and dry. No rash noted.  Multiple tattoos present.  Psychiatric: She has a normal mood and affect. Her behavior is normal.       Assessment:     Please refer to Problem List for Assessment.     Plan:     Please refer to Problem List for Plan.  Health Maintenance: - Pap Smear today. Will contact if abnormal. - Next Colonoscopy in 2018. - Wishes to initiated mammograms when 38yo.

## 2014-10-11 LAB — CYTOLOGY - PAP

## 2015-03-21 ENCOUNTER — Other Ambulatory Visit: Payer: Self-pay | Admitting: *Deleted

## 2015-03-21 DIAGNOSIS — I10 Essential (primary) hypertension: Secondary | ICD-10-CM

## 2015-03-21 MED ORDER — LISINOPRIL-HYDROCHLOROTHIAZIDE 20-25 MG PO TABS: 1.0000 | ORAL_TABLET | Freq: Every day | ORAL | Status: DC

## 2015-09-18 ENCOUNTER — Other Ambulatory Visit: Payer: Self-pay | Admitting: Family Medicine

## 2016-07-09 ENCOUNTER — Encounter (HOSPITAL_COMMUNITY): Payer: Self-pay

## 2016-07-09 ENCOUNTER — Emergency Department (HOSPITAL_COMMUNITY)
Admission: EM | Admit: 2016-07-09 | Discharge: 2016-07-09 | Disposition: A | Discharge status: Home / Self Care | Payer: Managed Care, Other (non HMO) | Attending: Emergency Medicine | Admitting: Emergency Medicine

## 2016-07-09 DIAGNOSIS — I10 Essential (primary) hypertension: Secondary | ICD-10-CM | POA: Insufficient documentation

## 2016-07-09 DIAGNOSIS — Z87891 Personal history of nicotine dependence: Secondary | ICD-10-CM | POA: Diagnosis not present

## 2016-07-09 DIAGNOSIS — Z5321 Procedure and treatment not carried out due to patient leaving prior to being seen by health care provider: Secondary | ICD-10-CM | POA: Insufficient documentation

## 2016-07-09 DIAGNOSIS — N920 Excessive and frequent menstruation with regular cycle: Secondary | ICD-10-CM | POA: Diagnosis present

## 2016-07-09 LAB — CBC
HEMATOCRIT: 34.8 % — AB (ref 36.0–46.0)
HEMOGLOBIN: 11 g/dL — AB (ref 12.0–15.0)
MCH: 27.6 pg (ref 26.0–34.0)
MCHC: 31.6 g/dL (ref 30.0–36.0)
MCV: 87.4 fL (ref 78.0–100.0)
Platelets: 364 10*3/uL (ref 150–400)
RBC: 3.98 MIL/uL (ref 3.87–5.11)
RDW: 15.2 % (ref 11.5–15.5)
WBC: 8.9 10*3/uL (ref 4.0–10.5)

## 2016-07-09 LAB — TYPE AND SCREEN
ABO/RH(D): A POS
ANTIBODY SCREEN: NEGATIVE

## 2016-07-09 LAB — I-STAT BETA HCG BLOOD, ED (MC, WL, AP ONLY)

## 2016-07-09 LAB — ABO/RH: ABO/RH(D): A POS

## 2016-07-09 NOTE — ED Notes (Signed)
Pts name called to reassess no answer

## 2016-07-09 NOTE — ED Notes (Signed)
Pt's name called to recheck vitals no answer 

## 2016-07-09 NOTE — ED Triage Notes (Signed)
Patient reports that she started her normal menstrual period on Sunday and had regular flow. Today had heavy bleeding with large clots x 6 hours, denies cramping. Alert and oriented, skin dry. VSS

## 2016-08-25 ENCOUNTER — Emergency Department (HOSPITAL_COMMUNITY)
Admission: EM | Admit: 2016-08-25 | Discharge: 2016-08-25 | Disposition: A | Discharge status: Home / Self Care | Payer: Managed Care, Other (non HMO) | Attending: Emergency Medicine | Admitting: Emergency Medicine

## 2016-08-25 DIAGNOSIS — R45851 Suicidal ideations: Secondary | ICD-10-CM | POA: Diagnosis present

## 2016-08-25 DIAGNOSIS — F10120 Alcohol abuse with intoxication, uncomplicated: Secondary | ICD-10-CM | POA: Insufficient documentation

## 2016-08-25 DIAGNOSIS — F10920 Alcohol use, unspecified with intoxication, uncomplicated: Secondary | ICD-10-CM

## 2016-08-25 DIAGNOSIS — I10 Essential (primary) hypertension: Secondary | ICD-10-CM | POA: Insufficient documentation

## 2016-08-25 DIAGNOSIS — Z87891 Personal history of nicotine dependence: Secondary | ICD-10-CM | POA: Diagnosis not present

## 2016-08-25 DIAGNOSIS — F419 Anxiety disorder, unspecified: Secondary | ICD-10-CM | POA: Diagnosis not present

## 2016-08-25 LAB — CBC WITH DIFFERENTIAL/PLATELET
BASOS ABS: 0 10*3/uL (ref 0.0–0.1)
BASOS PCT: 0 %
EOS ABS: 0 10*3/uL (ref 0.0–0.7)
EOS PCT: 0 %
HCT: 32.5 % — ABNORMAL LOW (ref 36.0–46.0)
HEMOGLOBIN: 10.5 g/dL — AB (ref 12.0–15.0)
LYMPHS ABS: 3.8 10*3/uL (ref 0.7–4.0)
Lymphocytes Relative: 40 %
MCH: 26.2 pg (ref 26.0–34.0)
MCHC: 32.3 g/dL (ref 30.0–36.0)
MCV: 81 fL (ref 78.0–100.0)
Monocytes Absolute: 0.4 10*3/uL (ref 0.1–1.0)
Monocytes Relative: 4 %
NEUTROS PCT: 56 %
Neutro Abs: 5.2 10*3/uL (ref 1.7–7.7)
PLATELETS: 467 10*3/uL — AB (ref 150–400)
RBC: 4.01 MIL/uL (ref 3.87–5.11)
RDW: 15.4 % (ref 11.5–15.5)
WBC: 9.5 10*3/uL (ref 4.0–10.5)

## 2016-08-25 LAB — COMPREHENSIVE METABOLIC PANEL
ALT: 27 U/L (ref 14–54)
AST: 38 U/L (ref 15–41)
Albumin: 4 g/dL (ref 3.5–5.0)
Alkaline Phosphatase: 84 U/L (ref 38–126)
Anion gap: 16 — ABNORMAL HIGH (ref 5–15)
BUN: 6 mg/dL (ref 6–20)
CHLORIDE: 99 mmol/L — AB (ref 101–111)
CO2: 24 mmol/L (ref 22–32)
CREATININE: 0.73 mg/dL (ref 0.44–1.00)
Calcium: 8.3 mg/dL — ABNORMAL LOW (ref 8.9–10.3)
Glucose, Bld: 137 mg/dL — ABNORMAL HIGH (ref 65–99)
POTASSIUM: 2.7 mmol/L — AB (ref 3.5–5.1)
Sodium: 139 mmol/L (ref 135–145)
Total Bilirubin: 0.3 mg/dL (ref 0.3–1.2)
Total Protein: 8.3 g/dL — ABNORMAL HIGH (ref 6.5–8.1)

## 2016-08-25 LAB — ACETAMINOPHEN LEVEL: Acetaminophen (Tylenol), Serum: <10 ug/mL — ABNORMAL LOW (ref 10–30)

## 2016-08-25 LAB — SALICYLATE LEVEL

## 2016-08-25 LAB — LIPASE, BLOOD: LIPASE: 19 U/L (ref 11–51)

## 2016-08-25 LAB — ETHANOL: ALCOHOL ETHYL (B): 343 mg/dL — AB (ref <5)

## 2016-08-25 MED ORDER — GI COCKTAIL ~~LOC~~
30.0000 mL | Freq: Once | ORAL | Status: AC
  Administered 2016-08-25: 30 mL via ORAL
  Filled 2016-08-25: qty 30

## 2016-08-25 MED ORDER — PROMETHAZINE HCL 12.5 MG RE SUPP
12.5000 mg | Freq: Four times a day (QID) | RECTAL | Status: DC | PRN
  Administered 2016-08-25: 12.5 mg via RECTAL
  Filled 2016-08-25 (×2): qty 1

## 2016-08-25 MED ORDER — ONDANSETRON 4 MG PO TBDP
4.0000 mg | ORAL_TABLET | Freq: Once | ORAL | Status: AC
  Administered 2016-08-25: 4 mg via ORAL
  Filled 2016-08-25: qty 1

## 2016-08-25 MED ORDER — POTASSIUM CHLORIDE CRYS ER 20 MEQ PO TBCR: 40.0000 meq | EXTENDED_RELEASE_TABLET | Freq: Every day | ORAL | Status: DC

## 2016-08-25 MED ORDER — POTASSIUM CHLORIDE CRYS ER 20 MEQ PO TBCR
40.0000 meq | EXTENDED_RELEASE_TABLET | Freq: Once | ORAL | Status: DC
  Filled 2016-08-25: qty 2

## 2016-08-25 NOTE — ED Notes (Signed)
Patient and belongings wanded by security.  

## 2016-08-25 NOTE — ED Notes (Signed)
Bed: WA03 Expected date:  Expected time:  Means of arrival:  Comments: EMS 
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
## 2016-08-25 NOTE — ED Provider Notes (Signed)
Emergency Department Provider Note   I have reviewed the triage vital signs and the nursing notes.   HISTORY  Chief Complaint Hyperventilating and Suicidal   HPI Kristin Lawrence is a 40 y.o. female with PMH of HTN, GERD, and anxiety presents to the emergency department for evaluation of alcohol intoxication, hyperventilation, and new onset suicidal thinking. The patient's history is somewhat limited by her alcohol intoxication. She denies any chest pain or difficulty breathing. Shortly into my interview the patient blurts out "I want to kill myself" but does not go into detail why. She reports that she does have a plan but will not tell me what it is. The patient's girlfriend arrives she endorses suicidal thinking to her as well "because you don't love me." She reports frequent alcohol use. No other drug use today. No history of complicated withdrawal symptoms.    Past Medical History:  Diagnosis Date  . Anemia   . Anxiety   . Benign hypertension   . Galactorrhea   . GERD (gastroesophageal reflux disease)   . Helicobacter pylori ab+ 12/11/09   Per Dr Rayetta Pigg note  . IBS (irritable bowel syndrome)   . Liver hemangioma   . Obesity   . Sleep apnea     Patient Active Problem List   Diagnosis Date Noted  . Lower extremity edema 10/10/2014  . Hypokalemia 01/26/2014  . Mass of hard palate 09/22/2012  . Polyp of colon 06/17/2012  . PCOS (polycystic ovarian syndrome) 03/21/2011  . HELICOBACTER PYLORI INFECTION 02/07/2010  . HYPERTENSION, BENIGN ESSENTIAL 07/15/2007  . Morbid obesity with BMI of 45.0-49.9, adult ([place]) 12/18/2006  . TENSION HEADACHE 12/18/2006  . GASTROESOPHAGEAL REFLUX, NO ESOPHAGITIS 12/18/2006    Past Surgical History:  Procedure Laterality Date  . NASAL SINUS SURGERY    . TUBAL LIGATION      Current Outpatient Rx  . Order #: [id number] Class: Historical Med  . Order #: [id number] Class: Normal  . Order #: [id number] Class: Historical Med     Allergies Patient has no known allergies.  Family History  Problem Relation Age of Onset  . Heart disease Mother 51    MI?  Marland Kitchen Colon cancer Mother 6  . Cystic fibrosis Mother   . Stroke Maternal Uncle   . Stroke Paternal Uncle   . Pancreatic cancer Maternal Grandmother   . Diabetes Maternal Grandmother   . Esophageal cancer Maternal Uncle   . Liver cancer Maternal Uncle   . Stomach cancer Paternal Grandfather     Social History Social History  Substance Use Topics  . Smoking status: Former Smoker    Types: Cigarettes    Quit date: 03/20/1997  . Smokeless tobacco: Never Used  . Alcohol use 8.4 oz/week    14 Shots of liquor per week     Comment: 3-4 daily     Review of Systems  Limited significantly by EtOH intoxication.   ____________________________________________   PHYSICAL EXAM:  VITAL SIGNS: ED Triage Vitals  Enc Vitals Group     BP 08/25/16 1504 137/93     Pulse Rate 08/25/16 1504 103     Resp 08/25/16 1504 (!) 28     Temp 08/25/16 1504 98.9 F (37.2 C)     Temp Source 08/25/16 1504 Oral     SpO2 08/25/16 1503 100 %   Constitutional: Anxious appearing and intermittently tearful.  Eyes: Conjunctivae are normal. PERRL.  Head: Atraumatic. Nose: No congestion/rhinnorhea. Mouth/Throat: Mucous membranes are moist.  Oropharynx non-erythematous. Neck: No stridor.  Cardiovascular:  Tachycardia. Good peripheral circulation. Grossly normal heart sounds.   Respiratory: Normal respiratory effort.  No retractions. Lungs CTAB. Gastrointestinal: Soft and nontender. No distention.  Musculoskeletal: No lower extremity tenderness nor edema. No gross deformities of extremities. Neurologic: Speech is slurred. No gross focal neurologic deficits are appreciated.  Skin:  Skin is warm, dry and intact. No rash noted  ____________________________________________   LABS (all labs ordered are listed, but only abnormal results are displayed)  Labs Reviewed   COMPREHENSIVE METABOLIC PANEL - Abnormal; Notable for the following:       Result Value   Potassium 2.7 (*)    Chloride 99 (*)    Glucose, Bld 137 (*)    Calcium 8.3 (*)    Total Protein 8.3 (*)    Anion gap 16 (*)    All other components within normal limits  CBC WITH DIFFERENTIAL/PLATELET - Abnormal; Notable for the following:    Hemoglobin 10.5 (*)    HCT 32.5 (*)    Platelets 467 (*)    All other components within normal limits  ETHANOL - Abnormal; Notable for the following:    Alcohol, Ethyl (B) 343 (*)    All other components within normal limits  ACETAMINOPHEN LEVEL - Abnormal; Notable for the following:    Acetaminophen (Tylenol), Serum <10 (*)    All other components within normal limits  LIPASE, BLOOD  SALICYLATE LEVEL   ____________________________________________  EKG   EKG Interpretation  Date/Time:  Sunday August 25 2016 15:33:04 EST Ventricular Rate:  97 PR Interval:    QRS Duration: 96 QT Interval:  363 QTC Calculation: 462 R Axis:   123 Text Interpretation:  Right and left arm electrode reversal, interpretation assumes no reversal Sinus rhythm Right axis deviation Low voltage, precordial leads No STEMI.  Similar to prior tracing.  Confirmed by Shneur Whittenburg MD, Anye Brose [phone]) on 08/25/2016 3:42:35 PM Also confirmed by Janah Mcculloh MD, Angelyse Heslin [phone]), editor [place] CT, Leda Gauze [phone])  on 08/25/2016 4:16:33 PM       ____________________________________________  RADIOLOGY  None ____________________________________________   PROCEDURES  Procedure(s) performed:   Procedures  None ____________________________________________   INITIAL IMPRESSION / ASSESSMENT AND PLAN / ED COURSE  Pertinent labs & imaging results that were available during my care of the patient were reviewed by me and considered in my medical decision making (see chart for details).  Patient resents to the emergency department for evaluation of alcohol intoxication and anxiety. During my  interview the patient she endorses suicidal thinking with the plan to harm herself but will not share the plan with him. Her exam is largely unremarkable with the exception of alcohol intoxication. Plan for medical screening labs and TTS evaluation after medically clear. No known history of prior suicidal thinking or attempts.   06:56 PM Patient with low potassium. No significant EKG changes. Plan for supplementation for 4 days and potassium re-check. Patient is otherwise medically clear for evaluation.   09:33 PM Spoke with behavioral health. They have evaluated the patient. She is denying all suicidal ideation at this time. Patient is currently asking to leave. I went and had a Sharanya Templin discussion with her. She is sitting up in bed and appears to be entirely sober. This is a much different presentation than when she initially arrived to the emergency department. She expresses concern that her 2 sons are here in the waiting room and other family members who are here and worried about her. He adamantly denies any suicidal thoughts or intent. She blames her  statements on being "beyond drunk" which is reflected on my initial exam and lab work. I discussed outpatient resources are available and encouraged her to return immediately with any thoughts or SI. I discussed that I plan to discharge her home at this time. She seems very appropriate and reasonable at this time and keeping her from her family seems to be causing her undue stress.   At this time, I do not feel there is any life-threatening condition present. I have reviewed and discussed all results (EKG, imaging, lab, urine as appropriate), exam findings with patient. I have reviewed nursing notes and appropriate previous records.  I feel the patient is safe to be discharged home without further emergent workup. Discussed usual and customary return precautions. Patient and family (if present) verbalize understanding and are comfortable with this plan.   Patient will follow-up with their primary care provider. If they do not have a primary care provider, information for follow-up has been provided to them. All questions have been answered.  ____________________________________________  FINAL CLINICAL IMPRESSION(S) / ED DIAGNOSES  Final diagnoses:  Alcoholic intoxication without complication ([place])     MEDICATIONS GIVEN DURING THIS VISIT:  Medications  ondansetron (ZOFRAN-ODT) disintegrating tablet 4 mg (4 mg Oral Given 08/25/16 1532)  ondansetron (ZOFRAN-ODT) disintegrating tablet 4 mg (4 mg Oral Given 08/25/16 1908)  gi cocktail (Maalox,Lidocaine,Donnatal) (30 mLs Oral Given 08/25/16 2024)     NEW OUTPATIENT MEDICATIONS STARTED DURING THIS VISIT:  None   Note:  This document was prepared using Dragon voice recognition software and may include unintentional dictation errors.  Nanda Quinton, MD Emergency Medicine   Margette Fast, MD 08/26/16 [phone]

## 2016-08-25 NOTE — ED Notes (Signed)
MD made aware patient unable to swallow potassium tablets. Patient tolerated one half of one tablet. MD still requesting patient transfer to psych hold.

## 2016-08-25 NOTE — ED Notes (Signed)
Patient and patient belongings wanded by security.

## 2016-08-25 NOTE — ED Notes (Signed)
ED Provider at bedside. 

## 2016-08-25 NOTE — Discharge Instructions (Signed)
You were seen in the emergency department for alcohol intoxication.  Please seek help from the recommended resources for assistance with your alcohol dependence.  If you have any thoughts of hurting herself or others, please call 911 or return to the emergency department.  Please avoid drug and alcohol use.  Never drive a vehicle or operate machinery while intoxicated.   Alcohol Intoxication Alcohol intoxication occurs when the amount of alcohol that a person has consumed impairs his or her ability to mentally and physically function. Alcohol directly impairs the normal chemical activity of the brain. Drinking large amounts of alcohol can lead to changes in mental function and behavior, and it can cause many physical effects that can be harmful.  Alcohol intoxication can range in severity from mild to very severe. Various factors can affect the level of intoxication that occurs, such as the person's age, gender, weight, frequency of alcohol consumption, and the presence of other medical conditions (such as diabetes, seizures, or heart conditions). Dangerous levels of alcohol intoxication may occur when people drink large amounts of alcohol in a short period (binge drinking). Alcohol can also be especially dangerous when combined with certain prescription medicines or "recreational" drugs. SIGNS AND SYMPTOMS Some common signs and symptoms of mild alcohol intoxication include: Loss of coordination. Changes in mood and behavior. Impaired judgment. Slurred speech. As alcohol intoxication progresses to more severe levels, other signs and symptoms will appear. These may include: Vomiting. Confusion and impaired memory. Slowed breathing. Seizures. Loss of consciousness. DIAGNOSIS  Your health care provider will take a medical history and perform a physical exam. You will be asked about the amount and type of alcohol you have consumed. Blood tests will be done to measure the concentration of alcohol in  your blood. In many places, your blood alcohol level must be lower than 80 mg/dL (0.08%) to legally drive. However, many dangerous effects of alcohol can occur at much lower levels.  TREATMENT  People with alcohol intoxication often do not require treatment. Most of the effects of alcohol intoxication are temporary, and they go away as the alcohol naturally leaves the body. Your health care provider will monitor your condition until you are stable enough to go home. Fluids are sometimes given through an IV access tube to help prevent dehydration.  HOME CARE INSTRUCTIONS Do not drive after drinking alcohol. Stay hydrated. Drink enough water and fluids to keep your urine clear or pale yellow. Avoid caffeine.   Only take over-the-counter or prescription medicines as directed by your health care provider.   SEEK MEDICAL CARE IF:  You have persistent vomiting.   You do not feel better after a few days. You have frequent alcohol intoxication. Your health care provider can help determine if you should see a substance use treatment counselor. SEEK IMMEDIATE MEDICAL CARE IF:  You become shaky or tremble when you try to stop drinking.   You shake uncontrollably (seizure).   You throw up (vomit) blood. This may be bright red or may look like black coffee grounds.   You have blood in your stool. This may be bright red or may appear as a black, tarry, bad smelling stool.   You become lightheaded or faint.   MAKE SURE YOU:  Understand these instructions. Will watch your condition. Will get help right away if you are not doing well or get worse. Document Released: 07/17/2005 Document Revised: 06/09/2013 Document Reviewed: 03/12/2013 [facility] Patient Information 2015 [place], [place]. This information is not intended to replace advice  given to you by your health care provider. Make sure you discuss any questions you have with your health care provider.  Finding Treatment for Alcohol and Drug Addiction It can  be hard to find the right place to get professional treatment. Here are some important things to consider: There are different types of treatment to choose from. Some programs are live-in (residential) while others are not (outpatient). Sometimes a combination is offered. No single type of program is right for everyone. Most treatment programs involve a combination of education, counseling, and a 12-step, spiritually-based approach. There are non-spiritually based programs (not 12-step). Some treatment programs are government sponsored. They are geared for patients without private insurance. Treatment programs can vary in many respects such as: Cost and types of insurance accepted. Types of on-site medical services offered. Length of stay, setting, and size. Overall philosophy of treatment.  A person may need specialized treatment or have needs not addressed by all programs. For example, adolescents need treatment appropriate for their age. Other people have secondary disorders that must be managed as well. Secondary conditions can include mental illness, such as depression or diabetes. Often, a period of detoxification from alcohol or drugs is needed. This requires medical supervision and not all programs offer this. THINGS TO CONSIDER WHEN SELECTING A TREATMENT PROGRAM  Is the program certified by the appropriate government agency? Even private programs must be certified and employ certified professionals. Does the program accept your insurance? If not, can a payment plan be set up? Is the facility clean, organized, and well run? Do they allow you to speak with graduates who can share their treatment experience with you? Can you tour the facility? Can you meet with staff? Does the program meet the full range of individual needs? Does the treatment program address sexual orientation and physical disabilities? Do they provide age, gender, and culturally appropriate treatment services? Is treatment  available in languages other than English? Is Jerra Huckeby-term aftercare support or guidance encouraged and provided? Is assessment of an individual's treatment plan ongoing to ensure it meets changing needs? Does the program use strategies to encourage reluctant patients to remain in treatment Basem Yannuzzi enough to increase the likelihood of success? Does the program offer counseling (individual or group) and other behavioral therapies? Does the program offer medicine as part of the treatment regimen, if needed? Is there ongoing monitoring of possible relapse? Is there a defined relapse prevention program? Are services or referrals offered to family members to ensure they understand addiction and the recovery process? This would help them support the recovering individual. Are 12-step meetings held at the center or is transport available for patients to attend outside meetings? In countries outside of the [place] and [place], [employer] for contact information for services in your area. Document Released: 09/05/2005 Document Revised: 12/30/2011 Document Reviewed: 03/17/2008 [facility] Patient Information 2015 [place], [place]. This information is not intended to replace advice given to you by your health care provider. Make sure you discuss any questions you have with your health care provider.

## 2016-08-25 NOTE — ED Notes (Signed)
Pt reports suicidal ideation. Pt denies plan. Pt also reports she has had "almost half a gallon of vodka."

## 2016-08-25 NOTE — ED Triage Notes (Signed)
Per EMS, patient's mother found patient throwing up on bathroom floor and hysterically crying. Per mother, patient has a lot of emotional issues (going through a lot with her friend). Patient has been drinking more heavily per mother. Patient currently hyperventilating and carpal pedal spasms

## 2016-08-25 NOTE — ED Notes (Signed)
Pt is requesting to please be discharged. She has had TTS consult and seems to be convinced she "just had a bad day and the utterance of being suicidal" were by mistake. I have expressed these concerns to EDP who reports he is on his way to see and talk to the pt.

## 2016-09-19 ENCOUNTER — Encounter: Payer: Self-pay | Admitting: Gastroenterology

## 2016-09-24 ENCOUNTER — Other Ambulatory Visit: Payer: Self-pay | Admitting: Family Medicine

## 2017-01-07 ENCOUNTER — Encounter: Payer: Self-pay | Admitting: Family Medicine

## 2017-01-07 ENCOUNTER — Ambulatory Visit (INDEPENDENT_AMBULATORY_CARE_PROVIDER_SITE_OTHER): Payer: Self-pay | Admitting: Family Medicine

## 2017-01-07 VITALS — BP 150/90 | HR 70 | Temp 98.0°F | Ht 66.0 in | Wt 287.4 lb

## 2017-01-07 DIAGNOSIS — I10 Essential (primary) hypertension: Secondary | ICD-10-CM

## 2017-01-07 DIAGNOSIS — R739 Hyperglycemia, unspecified: Secondary | ICD-10-CM

## 2017-01-07 DIAGNOSIS — R600 Localized edema: Secondary | ICD-10-CM

## 2017-01-07 LAB — POCT GLYCOSYLATED HEMOGLOBIN (HGB A1C): HEMOGLOBIN A1C: 5.8

## 2017-01-07 MED ORDER — LISINOPRIL-HYDROCHLOROTHIAZIDE 20-25 MG PO TABS: 1.0000 | ORAL_TABLET | Freq: Every day | ORAL | 3 refills | Status: DC

## 2017-01-07 NOTE — Patient Instructions (Signed)
Thank you so much for coming to visit today! I have refilled your blood pressure medicine. We will check several labs today due to your lower extremity edema. If the BNP is elevated, we may need an ultrasound of your heart.  Please return in 2 weeks for a blood pressure check.  Dr. Gerlean Ren

## 2017-01-08 LAB — BASIC METABOLIC PANEL
BUN/Creatinine Ratio: 11 (ref 9–23)
BUN: 7 mg/dL (ref 6–24)
CALCIUM: 9.5 mg/dL (ref 8.7–10.2)
CO2: 24 mmol/L (ref 18–29)
CREATININE: 0.66 mg/dL (ref 0.57–1.00)
Chloride: 101 mmol/L (ref 96–106)
GFR calc Af Amer: 128 mL/min/{1.73_m2} (ref >59)
GFR, EST NON AFRICAN AMERICAN: 111 mL/min/{1.73_m2} (ref >59)
Glucose: 91 mg/dL (ref 65–99)
POTASSIUM: 4.2 mmol/L (ref 3.5–5.2)
Sodium: 141 mmol/L (ref 134–144)

## 2017-01-08 LAB — CBC
HEMOGLOBIN: 8.9 g/dL — AB (ref 11.1–15.9)
Hematocrit: 28.4 % — ABNORMAL LOW (ref 34.0–46.6)
MCH: 24.8 pg — AB (ref 26.6–33.0)
MCHC: 31.3 g/dL — ABNORMAL LOW (ref 31.5–35.7)
MCV: 79 fL (ref 79–97)
Platelets: 457 10*3/uL — ABNORMAL HIGH (ref 150–379)
RBC: 3.59 x10E6/uL — AB (ref 3.77–5.28)
RDW: 17.5 % — ABNORMAL HIGH (ref 12.3–15.4)
WBC: 8 10*3/uL (ref 3.4–10.8)

## 2017-01-08 LAB — TSH: TSH: 4.9 u[IU]/mL — AB (ref 0.450–4.500)

## 2017-01-08 LAB — BRAIN NATRIURETIC PEPTIDE: BNP: 63.6 pg/mL (ref 0.0–100.0)

## 2017-01-08 NOTE — Progress Notes (Signed)
Subjective:     Patient ID: Kristin Lawrence, female   DOB: [birth year], 41 y.o.   MRN: [id number]  HPI Mrs. Kristin Lawrence is a 41yo female presenting today for follow up of blood pressure. States she ran out of her blood pressure medication, Lisinopril-HCTZ, two weeks ago and it was not refilled without an office visit since she has not been seen since 09/2014. States since she discontinued her blood pressure medication, she has noticed worsening bilateral lower extremity edema. Claims "I put on 10 pounds of water weight" over the last two weeks. Notes occasional shortness of breath at baseline, this is unchanged. Denies change in distance walked. Denies orthopnea and paroxysmal nocturnal dyspnea. Also requests initiate of Mammograms at age 75 since she has a strong family history of breast cancer. Last colonoscopy in 2014.    Review of Systems Per HPI    Objective:   Physical Exam  Constitutional: She appears well-developed and well-nourished. No distress.  HENT:  Head: Normocephalic and atraumatic.  Neck: No JVD present.  Cardiovascular: Normal rate and regular rhythm.   No murmur heard. Pulmonary/Chest: Effort normal. No respiratory distress. She has no wheezes.  Abdominal: Soft. She exhibits no distension. There is no tenderness.  Musculoskeletal:  Nonpitting lower extremity edema noted.  Psychiatric: She has a normal mood and affect. Her behavior is normal.      Assessment and Plan:     1. Essential hypertension BP elevated to 150/90, however out of BP medication for 2 weeks. Lisinopril/HCTZ refilled. Follow up in 2 weeks.  2. Hyperglycemia Glucose elevated on previous BMPs. Will check A1C today given increased risk of diabetes with hypertension.  3. Lower extremity edema Will obtain BMP, CBC, TSH, and BNP today. If BNP elevated, will consider Echo. Weight decreased from last office visit. Lisinopril/HCTZ refilled, which has helped in the past.

## 2017-01-21 ENCOUNTER — Telehealth: Payer: Self-pay | Admitting: Family Medicine

## 2017-01-21 NOTE — Telephone Encounter (Signed)
Anemia noted. Recommend follow up visit to discuss this further and obtain further labs.

## 2017-01-28 NOTE — Telephone Encounter (Signed)
Spoke to pt. Appt made for 01/31/17 with Dr. Gerlean Ren. Ottis Stain, CMA

## 2017-01-31 ENCOUNTER — Encounter: Payer: Self-pay | Admitting: Family Medicine

## 2017-01-31 ENCOUNTER — Ambulatory Visit (INDEPENDENT_AMBULATORY_CARE_PROVIDER_SITE_OTHER): Payer: Self-pay | Admitting: Family Medicine

## 2017-01-31 VITALS — BP 112/70 | HR 73 | Temp 98.3°F | Ht 66.0 in | Wt 275.0 lb

## 2017-01-31 DIAGNOSIS — D649 Anemia, unspecified: Secondary | ICD-10-CM

## 2017-01-31 NOTE — Patient Instructions (Signed)
Anemia, Nonspecific Anemia is a condition in which the concentration of red blood cells or hemoglobin in the blood is below normal. Hemoglobin is a substance in red blood cells that carries oxygen to the tissues of the body. Anemia results in not enough oxygen reaching these tissues. What are the causes? Common causes of anemia include:  Excessive bleeding. Bleeding may be internal or external. This includes excessive bleeding from periods (in women) or from the intestine.  Poor nutrition.  Chronic kidney, thyroid, and liver disease.  Bone marrow disorders that decrease red blood cell production.  Cancer and treatments for cancer.  HIV, AIDS, and their treatments.  Spleen problems that increase red blood cell destruction.  Blood disorders.  Excess destruction of red blood cells due to infection, medicines, and autoimmune disorders. What are the signs or symptoms?  Minor weakness.  Dizziness.  Headache.  Palpitations.  Shortness of breath, especially with exercise.  Paleness.  Cold sensitivity.  Indigestion.  Nausea.  Difficulty sleeping.  Difficulty concentrating. Symptoms may occur suddenly or they may develop slowly. How is this diagnosed? Additional blood tests are often needed. These help your health care provider determine the best treatment. Your health care provider will check your stool for blood and look for other causes of blood loss. How is this treated? Treatment varies depending on the cause of the anemia. Treatment can include:  Supplements of iron, vitamin B12, or folic acid.  Hormone medicines.  A blood transfusion. This may be needed if blood loss is severe.  Hospitalization. This may be needed if there is significant continual blood loss.  Dietary changes.  Spleen removal. Follow these instructions at home: Keep all follow-up appointments. It often takes many weeks to correct anemia, and having your health care provider check on your  condition and your response to treatment is very important. Get help right away if:  You develop extreme weakness, shortness of breath, or chest pain.  You become dizzy or have trouble concentrating.  You develop heavy vaginal bleeding.  You develop a rash.  You have bloody or black, tarry stools.  You faint.  You vomit up blood.  You vomit repeatedly.  You have abdominal pain.  You have a fever or persistent symptoms for more than 2-3 days.  You have a fever and your symptoms suddenly get worse.  You are dehydrated. This information is not intended to replace advice given to you by your health care provider. Make sure you discuss any questions you have with your health care provider. Document Released: 11/14/2004 Document Revised: 03/20/2016 Document Reviewed: 04/02/2013 Elsevier Interactive Patient Education  2017 [employer]  

## 2017-01-31 NOTE — Progress Notes (Signed)
Subjective:     Patient ID: Kristin Lawrence, female   DOB: [birth year], 41 y.o.   MRN: [id number]  HPI Kristin Lawrence is a 41yo female presenting today for follow up of anemia.  Reports long history of anemia, however last hemoglobin of 8.9 lower than prior. Reports she is feeling much better than last office visit and denies dizziness, fatigue, or shortness of breath. Edema resolved after restarting HCTZ. Notes she had heavier menstrual cycles than normal in October-November 2017, but these have normalized. Denies blood in stool or dark stools. Does have family history of colon cancer and she has had colonoscopy in 2014 with polyps removed and next colonoscopy planned for 2019. Takes multivitamin with Iron, Folate, and B12. Family history of thyroid problems. Former smoker.   Review of Systems Per HPI    Objective:   Physical Exam  Constitutional: She appears well-developed and well-nourished. No distress.  Eyes:  Pink conjunctiva  Cardiovascular: Normal rate and regular rhythm.   No murmur heard. Pulmonary/Chest: Effort normal. No respiratory distress. She has no wheezes.  Abdominal: Soft. She exhibits no distension. There is no tenderness.  Musculoskeletal: She exhibits no edema.  Skin: No rash noted.  Psychiatric: She has a normal mood and affect. Her behavior is normal.      Assessment and Plan:     1. Anemia, unspecified type Will obtain Anemia panel to include CBC with differential, Folate, Vitamin B12, Ferritin, Iron, TIBC. TSH also slightly elevated at last office visit, so will obtain T4 as well. Asymptomatic at this time.

## 2017-02-01 LAB — ANEMIA PROFILE B
BASOS ABS: 0 10*3/uL (ref 0.0–0.2)
Basos: 1 %
EOS (ABSOLUTE): 0.1 10*3/uL (ref 0.0–0.4)
EOS: 2 %
Ferritin: 15 ng/mL (ref 15–150)
HEMOGLOBIN: 9.7 g/dL — AB (ref 11.1–15.9)
Hematocrit: 30.3 % — ABNORMAL LOW (ref 34.0–46.6)
IMMATURE GRANS (ABS): 0 10*3/uL (ref 0.0–0.1)
IMMATURE GRANULOCYTES: 0 %
IRON SATURATION: 11 % — AB (ref 15–55)
IRON: 36 ug/dL (ref 27–159)
LYMPHS: 41 %
Lymphocytes Absolute: 2.4 10*3/uL (ref 0.7–3.1)
MCH: 25.6 pg — ABNORMAL LOW (ref 26.6–33.0)
MCHC: 32 g/dL (ref 31.5–35.7)
MCV: 80 fL (ref 79–97)
MONOCYTES: 5 %
Monocytes Absolute: 0.3 10*3/uL (ref 0.1–0.9)
NEUTROS PCT: 51 %
Neutrophils Absolute: 3.1 10*3/uL (ref 1.4–7.0)
Platelets: 481 10*3/uL — ABNORMAL HIGH (ref 150–379)
RBC: 3.79 x10E6/uL (ref 3.77–5.28)
RDW: 16.1 % — ABNORMAL HIGH (ref 12.3–15.4)
RETIC CT PCT: 1 % (ref 0.6–2.6)
TIBC: 331 ug/dL (ref 250–450)
UIBC: 295 ug/dL (ref 131–425)
Vitamin B-12: 559 pg/mL (ref 232–1245)
WBC: 5.9 10*3/uL (ref 3.4–10.8)

## 2017-02-01 LAB — T4, FREE: FREE T4: 0.95 ng/dL (ref 0.82–1.77)

## 2017-02-03 ENCOUNTER — Telehealth: Payer: Self-pay | Admitting: Family Medicine

## 2017-02-03 DIAGNOSIS — D75839 Thrombocytosis, unspecified: Secondary | ICD-10-CM

## 2017-02-03 DIAGNOSIS — D473 Essential (hemorrhagic) thrombocythemia: Secondary | ICD-10-CM

## 2017-02-03 NOTE — Telephone Encounter (Signed)
Labs are improved from last visit with hemoglobin 9.7, up from 8.9 at last office visit. Iron level is normal as is Folate and B12. Thyroid labs were also normal. Her Platelet Count is slightly elevated (481 when it should be below 450). I would recommend one additional lab, a blood smear, and I have place a future lab order for this visit. She may schedule a lab visit at her convenience.

## 2017-02-04 NOTE — Telephone Encounter (Signed)
Pt informed and lab appt made for Thursday. Cagney Steenson, Salome Spotted, CMA

## 2017-02-06 ENCOUNTER — Other Ambulatory Visit (INDEPENDENT_AMBULATORY_CARE_PROVIDER_SITE_OTHER): Payer: Self-pay

## 2017-02-06 DIAGNOSIS — D473 Essential (hemorrhagic) thrombocythemia: Secondary | ICD-10-CM

## 2017-02-06 DIAGNOSIS — D75839 Thrombocytosis, unspecified: Secondary | ICD-10-CM

## 2017-02-20 LAB — PATHOLOGIST SMEAR REVIEW
BASOS: 1 %
Basophils Absolute: 0 10*3/uL (ref 0.0–0.2)
EOS (ABSOLUTE): 0.1 10*3/uL (ref 0.0–0.4)
Eos: 2 %
HEMATOCRIT: 30.9 % — AB (ref 34.0–46.6)
HEMOGLOBIN: 9.6 g/dL — AB (ref 11.1–15.9)
IMMATURE GRANULOCYTES: 0 %
Immature Grans (Abs): 0 10*3/uL (ref 0.0–0.1)
LYMPHS ABS: 2.8 10*3/uL (ref 0.7–3.1)
LYMPHS: 45 %
MCH: 24.6 pg — ABNORMAL LOW (ref 26.6–33.0)
MCHC: 31.1 g/dL — AB (ref 31.5–35.7)
MCV: 79 fL (ref 79–97)
MONOS ABS: 0.4 10*3/uL (ref 0.1–0.9)
Monocytes: 6 %
NEUTROS PCT: 46 %
Neutrophils Absolute: 2.8 10*3/uL (ref 1.4–7.0)
Path Rev WBC: NORMAL
Platelets: 438 10*3/uL — ABNORMAL HIGH (ref 150–379)
RBC: 3.91 x10E6/uL (ref 3.77–5.28)
RDW: 16.2 % — AB (ref 12.3–15.4)
WBC: 6.1 10*3/uL (ref 3.4–10.8)

## 2017-05-06 ENCOUNTER — Other Ambulatory Visit: Payer: Self-pay | Admitting: Family Medicine

## 2017-05-06 DIAGNOSIS — Z1231 Encounter for screening mammogram for malignant neoplasm of breast: Secondary | ICD-10-CM

## 2017-05-13 ENCOUNTER — Encounter (INDEPENDENT_AMBULATORY_CARE_PROVIDER_SITE_OTHER): Payer: Self-pay

## 2017-05-13 ENCOUNTER — Ambulatory Visit
Admission: RE | Admit: 2017-05-13 | Discharge: 2017-05-13 | Disposition: A | Discharge status: Home / Self Care | Payer: Self-pay | Source: Ambulatory Visit | Attending: Family Medicine | Admitting: Family Medicine

## 2017-05-13 DIAGNOSIS — Z1231 Encounter for screening mammogram for malignant neoplasm of breast: Secondary | ICD-10-CM

## 2017-12-08 ENCOUNTER — Other Ambulatory Visit: Payer: Self-pay

## 2017-12-08 MED ORDER — LISINOPRIL-HYDROCHLOROTHIAZIDE 20-25 MG PO TABS: 1.0000 | ORAL_TABLET | Freq: Every day | ORAL | 1 refills | Status: DC

## 2018-06-08 ENCOUNTER — Other Ambulatory Visit: Payer: Self-pay | Admitting: Student in an Organized Health Care Education/Training Program

## 2018-06-09 NOTE — Telephone Encounter (Signed)
Will refill x1 month. Patient needs to come in for visit, it has been over a year.

## 2018-06-09 NOTE — Telephone Encounter (Signed)
Contacted pt and gave her the below information and scheduled her an appointment for 07/16/18 for physical. Katharina Caper, Randi College D, CMA

## 2018-07-16 ENCOUNTER — Other Ambulatory Visit (HOSPITAL_COMMUNITY)
Admission: RE | Admit: 2018-07-16 | Discharge: 2018-07-16 | Disposition: A | Discharge status: Home / Self Care | Payer: Medicaid Other | Source: Ambulatory Visit | Attending: Family Medicine | Admitting: Family Medicine

## 2018-07-16 ENCOUNTER — Other Ambulatory Visit: Payer: Self-pay

## 2018-07-16 ENCOUNTER — Encounter: Payer: Self-pay | Admitting: Student in an Organized Health Care Education/Training Program

## 2018-07-16 ENCOUNTER — Ambulatory Visit (INDEPENDENT_AMBULATORY_CARE_PROVIDER_SITE_OTHER): Payer: Self-pay | Admitting: Student in an Organized Health Care Education/Training Program

## 2018-07-16 VITALS — BP 108/74 | HR 74 | Temp 98.3°F | Ht 66.0 in | Wt 278.4 lb

## 2018-07-16 DIAGNOSIS — Z113 Encounter for screening for infections with a predominantly sexual mode of transmission: Secondary | ICD-10-CM | POA: Insufficient documentation

## 2018-07-16 DIAGNOSIS — Z124 Encounter for screening for malignant neoplasm of cervix: Secondary | ICD-10-CM | POA: Diagnosis present

## 2018-07-16 DIAGNOSIS — I10 Essential (primary) hypertension: Secondary | ICD-10-CM

## 2018-07-16 MED ORDER — LISINOPRIL-HYDROCHLOROTHIAZIDE 20-25 MG PO TABS: 1.0000 | ORAL_TABLET | Freq: Every day | ORAL | 0 refills | Status: DC

## 2018-07-16 NOTE — Patient Instructions (Signed)
It was a pleasure seeing you today in our clinic. Here is the treatment plan we have discussed and agreed upon together:  We drew blood work at today's visit. I will call or send you a letter with these results. If you do not hear from me within the next week, please give our office a call.  Our clinic's number is [phone]. Please call with questions or concerns about what we discussed today.  Be well, Dr. Shalandria Elsbernd   

## 2018-07-16 NOTE — Progress Notes (Signed)
CC: Well woman check  HPI: Kristin Lawrence is a 42 y.o. female with PMH significant for hypertension, GERD, overweight who presents to [facility] today for Pap smear and medication refill.  HTN  - she recently requested refill of her hypertensive medications, and I noted that she had not had labs in over a year.  She was asked to come in for a checkup.  She reports that her blood pressure is usually controlled, however she does sometimes have episodes of anxiety in which she checks her blood pressure and finds that it is elevated.  The highest number she has seen is 469 systolic.  Her blood pressure is very well controlled today.  She endorses no difficulty taking her medication, and 100% compliance.  No headaches, no blurry vision, no chest pain.  No dizziness or episodes of low blood pressure.  PAP -patient denies history of abnormal Pap in the past.  She is not having any abnormal vaginal symptoms including pruritus, discharge.  She is sexually active with a female partner.  She is open to STI checking today, though does not believe she has had an exposure.  Last Pap in our system was completed in 2015 and was normal.  Review of Symptoms:  See HPI for ROS.   CC, SH/smoking status, and VS noted.  Objective: BP 108/74   Pulse 74   Temp 98.3 F (36.8 C) (Oral)   Ht 5\' 6"  (1.676 m)   Wt 278 lb 6.4 oz (126.3 kg)   LMP 07/03/2018 (Exact Date)   SpO2 99%   BMI 44.93 kg/m  GEN: NAD, alert, cooperative, and pleasant. RESPIRATORY: clear to auscultation bilaterally with no wheezes, rhonchi or rales, good effort CV: RRR, no m/r/g, no peripheral edema GI: soft, non-tender, non-distended SKIN: warm and dry, no rashes or lesions NEURO: II-XII grossly intact PSYCH: AAOx3, appropriate affect Female genitalia: Vulva: normal appearing vulva with no masses, tenderness or lesions Vagina: normal appearing vagina with normal color and discharge, no lesions Cervix: normal appearing cervix without discharge  or lesions.  Cervix is tilted backwards and the office is very difficult to visualize.  Pap was done blindly because of this. Uterus: uterus is normal size, shape, consistency and nontender  Assessment and plan:  1. Screening for malignant neoplasm of cervix Last Pap was normal in 2015.  I let patient know that the brush was swabbed blindly due to difficulty visualizing the cervical office.  This is because of the osseous tilted backwards.  If we do not get satisfactory evaluation of the transformation zone we may require repeat test.  Patient voices understanding. - Cytology - PAP([place])  2. Screen for STD (sexually transmitted disease) patient is currently symptomatic, sexually active with a female partner.  Sex precautions reviewed. - Cervicovaginal ancillary only - HIV Antibody (routine testing w rflx) - RPR  3.  Hypertension well controlled.  No red flag symptoms.  Will continue current regimen.  Refilled HCTZ-lisinopril.  Will check a BMP for electrolytes and kidney function on this medication. - lisinopril-hydrochlorothiazide (PRINZIDE,ZESTORETIC) 20-25 MG tablet; Take 1 tablet by mouth daily.  Dispense: 90 tablet; Refill: 0 - Basic metabolic panel  No problem-specific Assessment & Plan notes found for this encounter.   Orders Placed This Encounter  Procedures  . Basic metabolic panel  . HIV Antibody (routine testing w rflx)  . RPR    Meds ordered this encounter  Medications  . lisinopril-hydrochlorothiazide (PRINZIDE,ZESTORETIC) 20-25 MG tablet    Sig: Take 1 tablet by  mouth daily.    Dispense:  90 tablet    Refill:  0     Everrett Coombe, MD,MS,  PGY3 07/16/2018 4:56 PM

## 2018-07-17 LAB — BASIC METABOLIC PANEL
BUN / CREAT RATIO: 16 (ref 9–23)
BUN: 12 mg/dL (ref 6–24)
CO2: 25 mmol/L (ref 20–29)
CREATININE: 0.73 mg/dL (ref 0.57–1.00)
Calcium: 9.7 mg/dL (ref 8.7–10.2)
Chloride: 101 mmol/L (ref 96–106)
GFR calc non Af Amer: 103 mL/min/{1.73_m2} (ref >59)
GFR, EST AFRICAN AMERICAN: 118 mL/min/{1.73_m2} (ref >59)
Glucose: 91 mg/dL (ref 65–99)
Potassium: 4 mmol/L (ref 3.5–5.2)
Sodium: 139 mmol/L (ref 134–144)

## 2018-07-17 LAB — HIV ANTIBODY (ROUTINE TESTING W REFLEX): HIV SCREEN 4TH GENERATION: NONREACTIVE

## 2018-07-17 LAB — CERVICOVAGINAL ANCILLARY ONLY
Chlamydia: NEGATIVE
Neisseria Gonorrhea: NEGATIVE

## 2018-07-17 LAB — RPR: RPR: NONREACTIVE

## 2018-07-21 ENCOUNTER — Telehealth: Payer: Self-pay

## 2018-07-21 LAB — CYTOLOGY - PAP
Diagnosis: NEGATIVE
HPV: NOT DETECTED

## 2018-07-21 NOTE — Telephone Encounter (Signed)
Pt LVM on nurse line stating she has been having some cramping and spotting since her pap smear on 9/26. Pt stated that since yesterday the cramping has increased and she has been passing some blood clots. Per chart review, pts last LMP 9/13. Pt would like a call back regarding this.

## 2018-07-22 ENCOUNTER — Encounter: Payer: Self-pay | Admitting: Student in an Organized Health Care Education/Training Program

## 2018-07-22 NOTE — Telephone Encounter (Signed)
Attempted to call patient but no answer. LVM that if she is still having an issue she can give our office a call back.

## 2018-10-10 ENCOUNTER — Other Ambulatory Visit: Payer: Self-pay | Admitting: Student in an Organized Health Care Education/Training Program

## 2018-10-10 DIAGNOSIS — I10 Essential (primary) hypertension: Secondary | ICD-10-CM

## 2019-01-03 ENCOUNTER — Other Ambulatory Visit: Payer: Self-pay | Admitting: Family Medicine

## 2019-01-03 DIAGNOSIS — I10 Essential (primary) hypertension: Secondary | ICD-10-CM

## 2019-04-09 ENCOUNTER — Other Ambulatory Visit: Payer: Self-pay | Admitting: Student in an Organized Health Care Education/Training Program

## 2019-04-09 DIAGNOSIS — I10 Essential (primary) hypertension: Secondary | ICD-10-CM

## 2019-06-27 ENCOUNTER — Other Ambulatory Visit: Payer: Self-pay | Admitting: Student in an Organized Health Care Education/Training Program

## 2019-06-27 DIAGNOSIS — I10 Essential (primary) hypertension: Secondary | ICD-10-CM

## 2019-09-26 ENCOUNTER — Other Ambulatory Visit: Payer: Self-pay | Admitting: Family Medicine

## 2019-09-26 DIAGNOSIS — I10 Essential (primary) hypertension: Secondary | ICD-10-CM

## 2019-11-29 ENCOUNTER — Ambulatory Visit (INDEPENDENT_AMBULATORY_CARE_PROVIDER_SITE_OTHER): Payer: Managed Care, Other (non HMO) | Admitting: Nurse Practitioner

## 2019-11-29 ENCOUNTER — Other Ambulatory Visit: Payer: Self-pay

## 2019-11-29 ENCOUNTER — Encounter: Payer: Self-pay | Admitting: Nurse Practitioner

## 2019-11-29 ENCOUNTER — Encounter: Payer: Self-pay | Admitting: Gastroenterology

## 2019-11-29 VITALS — BP 130/78 | HR 72 | Temp 97.7°F | Ht 66.5 in | Wt 284.0 lb

## 2019-11-29 DIAGNOSIS — R739 Hyperglycemia, unspecified: Secondary | ICD-10-CM

## 2019-11-29 DIAGNOSIS — Z1231 Encounter for screening mammogram for malignant neoplasm of breast: Secondary | ICD-10-CM

## 2019-11-29 DIAGNOSIS — Z0001 Encounter for general adult medical examination with abnormal findings: Secondary | ICD-10-CM

## 2019-11-29 DIAGNOSIS — Z0000 Encounter for general adult medical examination without abnormal findings: Secondary | ICD-10-CM

## 2019-11-29 DIAGNOSIS — Z23 Encounter for immunization: Secondary | ICD-10-CM | POA: Diagnosis not present

## 2019-11-29 DIAGNOSIS — I10 Essential (primary) hypertension: Secondary | ICD-10-CM

## 2019-11-29 DIAGNOSIS — Z1211 Encounter for screening for malignant neoplasm of colon: Secondary | ICD-10-CM

## 2019-11-29 DIAGNOSIS — Z136 Encounter for screening for cardiovascular disorders: Secondary | ICD-10-CM

## 2019-11-29 DIAGNOSIS — T502X5A Adverse effect of carbonic-anhydrase inhibitors, benzothiadiazides and other diuretics, initial encounter: Secondary | ICD-10-CM

## 2019-11-29 DIAGNOSIS — D500 Iron deficiency anemia secondary to blood loss (chronic): Secondary | ICD-10-CM

## 2019-11-29 DIAGNOSIS — E876 Hypokalemia: Secondary | ICD-10-CM | POA: Diagnosis not present

## 2019-11-29 DIAGNOSIS — Z13220 Encounter for screening for lipoid disorders: Secondary | ICD-10-CM | POA: Diagnosis not present

## 2019-11-29 LAB — CBC WITH DIFFERENTIAL/PLATELET
Basophils Absolute: 0 10*3/uL (ref 0.0–0.1)
Basophils Relative: 0.7 % (ref 0.0–3.0)
Eosinophils Absolute: 0.1 10*3/uL (ref 0.0–0.7)
Eosinophils Relative: 1.9 % (ref 0.0–5.0)
HCT: 35.4 % — ABNORMAL LOW (ref 36.0–46.0)
Hemoglobin: 11.7 g/dL — ABNORMAL LOW (ref 12.0–15.0)
Lymphocytes Relative: 36.8 % (ref 12.0–46.0)
Lymphs Abs: 2 10*3/uL (ref 0.7–4.0)
MCHC: 33.1 g/dL (ref 30.0–36.0)
MCV: 89.4 fl (ref 78.0–100.0)
Monocytes Absolute: 0.3 10*3/uL (ref 0.1–1.0)
Monocytes Relative: 5.3 % (ref 3.0–12.0)
Neutro Abs: 3.1 10*3/uL (ref 1.4–7.7)
Neutrophils Relative %: 55.3 % (ref 43.0–77.0)
Platelets: 420 10*3/uL — ABNORMAL HIGH (ref 150.0–400.0)
RBC: 3.96 Mil/uL (ref 3.87–5.11)
RDW: 14.5 % (ref 11.5–15.5)
WBC: 5.5 10*3/uL (ref 4.0–10.5)

## 2019-11-29 LAB — COMPREHENSIVE METABOLIC PANEL
ALT: 13 U/L (ref 0–35)
AST: 13 U/L (ref 0–37)
Albumin: 4 g/dL (ref 3.5–5.2)
Alkaline Phosphatase: 74 U/L (ref 39–117)
BUN: 8 mg/dL (ref 6–23)
CO2: 29 mEq/L (ref 19–32)
Calcium: 9.1 mg/dL (ref 8.4–10.5)
Chloride: 102 mEq/L (ref 96–112)
Creatinine, Ser: 0.75 mg/dL (ref 0.40–1.20)
GFR: 102.02 mL/min (ref >60.00)
Glucose, Bld: 113 mg/dL — ABNORMAL HIGH (ref 70–99)
Potassium: 3.3 mEq/L — ABNORMAL LOW (ref 3.5–5.1)
Sodium: 139 mEq/L (ref 135–145)
Total Bilirubin: 0.3 mg/dL (ref 0.2–1.2)
Total Protein: 7.5 g/dL (ref 6.0–8.3)

## 2019-11-29 LAB — LIPID PANEL
Cholesterol: 155 mg/dL (ref 0–200)
HDL: 46.2 mg/dL (ref >39.00)
LDL Cholesterol: 99 mg/dL (ref 0–99)
NonHDL: 108.97
Total CHOL/HDL Ratio: 3
Triglycerides: 50 mg/dL (ref 0.0–149.0)
VLDL: 10 mg/dL (ref 0.0–40.0)

## 2019-11-29 LAB — TSH: TSH: 1.46 u[IU]/mL (ref 0.35–4.50)

## 2019-11-29 LAB — HEMOGLOBIN A1C: Hgb A1c MFr Bld: 6.2 % (ref 4.6–6.5)

## 2019-11-29 NOTE — Patient Instructions (Signed)
Thank you for choosing [facility] for your health needs.  Let me know if you change your mind about referral for Genetic counseling.  You will be contacted to schedule appt for mammogram and colonoscopy.  Go to lab for blood draw.   Health Maintenance, Female Adopting a healthy lifestyle and getting preventive care are important in promoting health and wellness. Ask your health care provider about:  The right schedule for you to have regular tests and exams.  Things you can do on your own to prevent diseases and keep yourself healthy. What should I know about diet, weight, and exercise? Eat a healthy diet   Eat a diet that includes plenty of vegetables, fruits, low-fat dairy products, and lean protein.  Do not eat a lot of foods that are high in solid fats, added sugars, or sodium. Maintain a healthy weight Body mass index (BMI) is used to identify weight problems. It estimates body fat based on height and weight. Your health care provider can help determine your BMI and help you achieve or maintain a healthy weight. Get regular exercise Get regular exercise. This is one of the most important things you can do for your health. Most adults should:  Exercise for at least 150 minutes each week. The exercise should increase your heart rate and make you sweat (moderate-intensity exercise).  Do strengthening exercises at least twice a week. This is in addition to the moderate-intensity exercise.  Spend less time sitting. Even light physical activity can be beneficial. Watch cholesterol and blood lipids Have your blood tested for lipids and cholesterol at 43 years of age, then have this test every 5 years. Have your cholesterol levels checked more often if:  Your lipid or cholesterol levels are high.  You are older than 43 years of age.  You are at high risk for heart disease. What should I know about cancer screening? Depending on your health history and family history,  you may need to have cancer screening at various ages. This may include screening for:  Breast cancer.  Cervical cancer.  Colorectal cancer.  Skin cancer.  Lung cancer. What should I know about heart disease, diabetes, and high blood pressure? Blood pressure and heart disease  High blood pressure causes heart disease and increases the risk of stroke. This is more likely to develop in people who have high blood pressure readings, are of African descent, or are overweight.  Have your blood pressure checked: ? Every 3-5 years if you are 35-84 years of age. ? Every year if you are 19 years old or older. Diabetes Have regular diabetes screenings. This checks your fasting blood sugar level. Have the screening done:  Once every three years after age 87 if you are at a normal weight and have a low risk for diabetes.  More often and at a younger age if you are overweight or have a high risk for diabetes. What should I know about preventing infection? Hepatitis B If you have a higher risk for hepatitis B, you should be screened for this virus. Talk with your health care provider to find out if you are at risk for hepatitis B infection. Hepatitis C Testing is recommended for:  Everyone born from [birth year] through 1964.  Anyone with known risk factors for hepatitis C. Sexually transmitted infections (STIs)  Get screened for STIs, including gonorrhea and chlamydia, if: ? You are sexually active and are younger than 43 years of age. ? You are older than 43 years  of age and your health care provider tells you that you are at risk for this type of infection. ? Your sexual activity has changed since you were last screened, and you are at increased risk for chlamydia or gonorrhea. Ask your health care provider if you are at risk.  Ask your health care provider about whether you are at high risk for HIV. Your health care provider may recommend a prescription medicine to help prevent HIV infection.  If you choose to take medicine to prevent HIV, you should first get tested for HIV. You should then be tested every 3 months for as long as you are taking the medicine. Pregnancy  If you are about to stop having your period (premenopausal) and you may become pregnant, seek counseling before you get pregnant.  Take 400 to 800 micrograms (mcg) of folic acid every day if you become pregnant.  Ask for birth control (contraception) if you want to prevent pregnancy. Osteoporosis and menopause Osteoporosis is a disease in which the bones lose minerals and strength with aging. This can result in bone fractures. If you are 21 years old or older, or if you are at risk for osteoporosis and fractures, ask your health care provider if you should:  Be screened for bone loss.  Take a calcium or vitamin D supplement to lower your risk of fractures.  Be given hormone replacement therapy (HRT) to treat symptoms of menopause. Follow these instructions at home: Lifestyle  Do not use any products that contain nicotine or tobacco, such as cigarettes, e-cigarettes, and chewing tobacco. If you need help quitting, ask your health care provider.  Do not use street drugs.  Do not share needles.  Ask your health care provider for help if you need support or information about quitting drugs. Alcohol use  Do not drink alcohol if: ? Your health care provider tells you not to drink. ? You are pregnant, may be pregnant, or are planning to become pregnant.  If you drink alcohol: ? Limit how much you use to 0-1 drink a day. ? Limit intake if you are breastfeeding.  Be aware of how much alcohol is in your drink. In the [place] one drink equals one 12 oz bottle of beer (355 mL), one 5 oz glass of wine (148 mL), or one 1 oz glass of hard liquor (44 mL). General instructions  Schedule regular health, dental, and eye exams.  Stay current with your vaccines.  Tell your health care provider if: ? You often feel  depressed. ? You have ever been abused or do not feel safe at home. Summary  Adopting a healthy lifestyle and getting preventive care are important in promoting health and wellness.  Follow your health care provider's instructions about healthy diet, exercising, and getting tested or screened for diseases.  Follow your health care provider's instructions on monitoring your cholesterol and blood pressure. This information is not intended to replace advice given to you by your health care provider. Make sure you discuss any questions you have with your health care provider. Document Revised: 09/30/2018 Document Reviewed: 09/30/2018 Elsevier Patient Education  2020 [employer].

## 2019-11-29 NOTE — Progress Notes (Signed)
Subjective:    Patient ID: Kristin Lawrence, female    DOB: 1976-05-09, 44 y.o.   MRN: [id number]  Patient presents today for complete physical and discuss chronic conditions  HPI  reports family hx of multiple cancers in first degree relatives: Colon, Breast, Liver, Pancreatic and Stomach. She agreed to mammogram and referral to GI today. She declined referral for genetic counseling at this time.  Hx of HTN and hyperglycemia:  Current use of lisinopril/HCTZ and BP at goal. BP Readings from Last 3 Encounters:  11/29/19 130/78  07/16/18 108/74  01/31/17 112/70   Sexual History (orientation,birth control, marital status, STD):engaged, sexually active with female partner, lives with fiancee and 2 adults sons. Up to date with PAP (2019, no hx of abnormal PAP or HPV)  Depression/Suicide: Depression screen [facility] 2/9 11/29/2019 07/16/2018 01/31/2017 10/10/2014 06/17/2012  Decreased Interest 0 0 0 0 0  Down, Depressed, Hopeless 0 0 0 0 0  PHQ - 2 Score 0 0 0 0 0   Vision:will schedule  Dental:will schedule  Immunizations: (TDAP, Hep C screen, Pneumovax, Influenza, zoster)  Health Maintenance  Topic Date Due  . Colon Cancer Screening  11/19/2016  . Pap Smear  07/16/2021  . Tetanus Vaccine  11/28/2029  . Flu Shot  Completed  . HIV Screening  Completed   Diet: regular.  Weight:  Wt Readings from Last 3 Encounters:  11/29/19 284 lb (128.8 kg)  07/16/18 278 lb 6.4 oz (126.3 kg)  01/31/17 275 lb (124.7 kg)   Exercise:none  Fall Risk: Fall Risk  11/29/2019  Falls in the past year? 0   Medications and allergies reviewed with patient and updated if appropriate.  Patient Active Problem List   Diagnosis Date Noted  . Iron deficiency anemia due to chronic blood loss 11/30/2019  . Hyperglycemia 11/30/2019  . Lower extremity edema 10/10/2014  . Diuretic-induced hypokalemia 01/26/2014  . Mass of hard palate 09/22/2012  . Polyp of colon 06/17/2012  . PCOS (polycystic ovarian syndrome)  03/21/2011  . HELICOBACTER PYLORI INFECTION 02/07/2010  . HYPERTENSION, BENIGN ESSENTIAL 07/15/2007  . Morbid obesity with BMI of 45.0-49.9, adult ([place]) 12/18/2006  . TENSION HEADACHE 12/18/2006  . GASTROESOPHAGEAL REFLUX, NO ESOPHAGITIS 12/18/2006    Current Outpatient Medications on File Prior to Visit  Medication Sig Dispense Refill  . amoxicillin (AMOXIL) 500 MG tablet Take 500 mg by mouth 3 (three) times daily.    . Ferrous Sulfate (IRON PO) Take by mouth.    . FOLIC ACID PO Take by mouth.    Marland Kitchen omeprazole (PRILOSEC OTC) 20 MG tablet Take 20 mg by mouth daily.    Marland Kitchen OVER THE COUNTER MEDICATION Joints support    . aspirin EC 81 MG tablet Take 81 mg by mouth daily.     No current facility-administered medications on file prior to visit.    Past Medical History:  Diagnosis Date  . Anemia   . Anxiety   . Benign hypertension   . Galactorrhea   . GERD (gastroesophageal reflux disease)   . Helicobacter pylori ab+ 12/11/09   Per Dr Rayetta Pigg note  . IBS (irritable bowel syndrome)   . Liver hemangioma   . Obesity   . Sleep apnea     Past Surgical History:  Procedure Laterality Date  . NASAL SINUS SURGERY     twice 2019 and 2011  . TUBAL LIGATION      Social History   Socioeconomic History  . Marital status: Divorced    Spouse name:  Not on file  . Number of children: Not on file  . Years of education: Not on file  . Highest education level: Not on file  Occupational History  . Occupation: Programmer, multimedia   Tobacco Use  . Smoking status: Never Smoker  . Smokeless tobacco: Never Used  Substance and Sexual Activity  . Alcohol use: Yes    Alcohol/week: 14.0 standard drinks    Types: 14 Shots of liquor per week    Comment: 2-3 a week  . Drug use: No  . Sexual activity: Yes    Birth control/protection: Surgical  Other Topics Concern  . Not on file  Social History Narrative   Works at [employer] as a Merchant navy officer.  Lives with husband, Same sex domestic partner, and  two sons 9, 36.   Daily caffeine    Social Determinants of Health   Financial Resource Strain:   . Difficulty of Paying Living Expenses: Not on file  Food Insecurity:   . Worried About Charity fundraiser in the Last Year: Not on file  . Ran Out of Food in the Last Year: Not on file  Transportation Needs:   . Lack of Transportation (Medical): Not on file  . Lack of Transportation (Non-Medical): Not on file  Physical Activity:   . Days of Exercise per Week: Not on file  . Minutes of Exercise per Session: Not on file  Stress:   . Feeling of Stress : Not on file  Social Connections:   . Frequency of Communication with Friends and Family: Not on file  . Frequency of Social Gatherings with Friends and Family: Not on file  . Attends Religious Services: Not on file  . Active Member of Clubs or Organizations: Not on file  . Attends Archivist Meetings: Not on file  . Marital Status: Not on file    Family History  Problem Relation Age of Onset  . Heart disease Mother 55       MI?  Marland Kitchen Colon cancer Mother 47  . Cystic fibrosis Mother   . Anuerysm Mother   . Stroke Maternal Uncle   . Stroke Paternal Uncle   . Pancreatic cancer Maternal Grandmother   . Diabetes Maternal Grandmother   . Esophageal cancer Maternal Uncle   . Liver cancer Maternal Uncle   . Stomach cancer Paternal Grandfather   . Breast cancer Paternal Aunt        20's        Review of Systems  Constitutional: Negative for fever, malaise/fatigue and weight loss.  HENT: Negative for congestion and sore throat.   Eyes:       Negative for visual changes  Respiratory: Negative for cough and shortness of breath.   Cardiovascular: Negative for chest pain, palpitations and leg swelling.  Gastrointestinal: Negative for blood in stool, constipation, diarrhea and heartburn.  Genitourinary: Negative for dysuria, frequency and urgency.  Musculoskeletal: Negative for falls, joint pain and myalgias.  Skin:  Negative for rash.  Neurological: Negative for dizziness, sensory change and headaches.  Endo/Heme/Allergies: Does not bruise/bleed easily.  Psychiatric/Behavioral: Negative for depression, substance abuse and suicidal ideas. The patient is not nervous/anxious.    Objective:   Vitals:   11/29/19 0817  BP: 130/78  Pulse: 72  Temp: 97.7 F (36.5 C)  SpO2: 98%   Body mass index is 45.15 kg/m.  Physical Examination:  Physical Exam Vitals reviewed.  Constitutional:      General: She is not in acute distress.  Appearance: She is well-developed. She is obese.  HENT:     Right Ear: Tympanic membrane, ear canal and external ear normal.     Left Ear: Tympanic membrane, ear canal and external ear normal.  Eyes:     Extraocular Movements: Extraocular movements intact.     Conjunctiva/sclera: Conjunctivae normal.  Cardiovascular:     Rate and Rhythm: Normal rate and regular rhythm.     Heart sounds: Normal heart sounds.  Pulmonary:     Effort: Pulmonary effort is normal. No respiratory distress.     Breath sounds: Normal breath sounds.  Chest:     Chest wall: No tenderness.  Abdominal:     General: Bowel sounds are normal.  Genitourinary:    Comments: Deferred to next year per patient Musculoskeletal:        General: Normal range of motion.     Cervical back: Normal range of motion and neck supple.  Lymphadenopathy:     Cervical: No cervical adenopathy.  Skin:    General: Skin is warm and dry.  Neurological:     Mental Status: She is alert and oriented to person, place, and time.     Deep Tendon Reflexes: Reflexes are normal and symmetric.  Psychiatric:        Mood and Affect: Mood normal.        Behavior: Behavior normal.        Thought Content: Thought content normal.     ASSESSMENT and PLAN: This visit occurred during the SARS-CoV-2 public health emergency.  Safety protocols were in place, including screening questions prior to the visit, additional usage of staff  PPE, and extensive cleaning of exam room while observing appropriate contact time as indicated for disinfecting solutions.   Heathe was seen today for annual exam.  Diagnoses and all orders for this visit:  Encounter for preventative adult health care exam with abnormal findings -     Lipid panel -     Comprehensive metabolic panel -     MM DIGITAL SCREENING BILATERAL; Future -     Ambulatory referral to Gastroenterology -     TSH  HYPERTENSION, BENIGN ESSENTIAL -     lisinopril-hydrochlorothiazide (ZESTORETIC) 20-25 MG tablet; Take 1 tablet by mouth daily.  Hyperglycemia -     Hemoglobin A1c  Encounter for lipid screening for cardiovascular disease -     Lipid panel  Breast cancer screening by mammogram -     MM DIGITAL SCREENING BILATERAL; Future  Colon cancer screening -     Ambulatory referral to Gastroenterology  Iron deficiency anemia due to chronic blood loss -     CBC w/Diff -     Iron, TIBC and Ferritin Panel  Need for diphtheria-tetanus-pertussis (Tdap) vaccine -     Tdap vaccine greater than or equal to 7yo IM  Diuretic-induced hypokalemia -     potassium chloride SA (KLOR-CON) 20 MEQ tablet; Take 2 tablets (40 mEq total) by mouth daily.   No problem-specific Assessment & Plan notes found for this encounter.      Problem List Items Addressed This Visit      Cardiovascular and Mediastinum   HYPERTENSION, BENIGN ESSENTIAL   Relevant Medications   lisinopril-hydrochlorothiazide (ZESTORETIC) 20-25 MG tablet     Other   Diuretic-induced hypokalemia   Relevant Medications   potassium chloride SA (KLOR-CON) 20 MEQ tablet   Hyperglycemia   Relevant Orders   Hemoglobin A1c (Completed)   Iron deficiency anemia due to chronic blood loss  Relevant Medications   FOLIC ACID PO   Ferrous Sulfate (IRON PO)   Other Relevant Orders   CBC w/Diff (Completed)   Iron, TIBC and Ferritin Panel (Completed)    Other Visit Diagnoses    Encounter for preventative  adult health care exam with abnormal findings    -  Primary   Relevant Orders   Lipid panel (Completed)   Comprehensive metabolic panel (Completed)   MM DIGITAL SCREENING BILATERAL   Ambulatory referral to Gastroenterology   TSH (Completed)   Encounter for lipid screening for cardiovascular disease       Relevant Orders   Lipid panel (Completed)   Breast cancer screening by mammogram       Relevant Orders   MM DIGITAL SCREENING BILATERAL   Colon cancer screening       Relevant Orders   Ambulatory referral to Gastroenterology   Need for diphtheria-tetanus-pertussis (Tdap) vaccine       Relevant Orders   Tdap vaccine greater than or equal to 7yo IM (Completed)       Follow up: Return in about 3 months (around 02/26/2020) for HTN (video).  Wilfred Lacy, NP

## 2019-11-30 DIAGNOSIS — R739 Hyperglycemia, unspecified: Secondary | ICD-10-CM | POA: Insufficient documentation

## 2019-11-30 DIAGNOSIS — D500 Iron deficiency anemia secondary to blood loss (chronic): Secondary | ICD-10-CM | POA: Insufficient documentation

## 2019-11-30 DIAGNOSIS — R7303 Prediabetes: Secondary | ICD-10-CM | POA: Insufficient documentation

## 2019-11-30 LAB — IRON,TIBC AND FERRITIN PANEL
%SAT: 17 % (calc) (ref 16–45)
Ferritin: 54 ng/mL (ref 16–232)
Iron: 55 ug/dL (ref 40–190)
TIBC: 318 mcg/dL (calc) (ref 250–450)

## 2019-11-30 MED ORDER — LISINOPRIL-HYDROCHLOROTHIAZIDE 20-25 MG PO TABS: 1.0000 | ORAL_TABLET | Freq: Every day | ORAL | 1 refills | Status: DC

## 2019-11-30 MED ORDER — POTASSIUM CHLORIDE CRYS ER 20 MEQ PO TBCR: 40.0000 meq | EXTENDED_RELEASE_TABLET | Freq: Every day | ORAL | 0 refills | Status: DC

## 2019-12-02 ENCOUNTER — Encounter: Payer: Self-pay | Admitting: Nurse Practitioner

## 2019-12-24 ENCOUNTER — Other Ambulatory Visit: Payer: Self-pay | Admitting: Family Medicine

## 2019-12-24 DIAGNOSIS — I10 Essential (primary) hypertension: Secondary | ICD-10-CM

## 2019-12-27 ENCOUNTER — Other Ambulatory Visit: Payer: Self-pay

## 2019-12-27 ENCOUNTER — Ambulatory Visit (AMBULATORY_SURGERY_CENTER): Payer: Self-pay

## 2019-12-27 VITALS — Temp 97.5°F | Ht 66.5 in | Wt 285.0 lb

## 2019-12-27 DIAGNOSIS — Z800 Family history of malignant neoplasm of digestive organs: Secondary | ICD-10-CM

## 2019-12-27 MED ORDER — NA SULFATE-K SULFATE-MG SULF 17.5-3.13-1.6 GM/177ML PO SOLN: 1.0000 | Freq: Once | ORAL | 0 refills | Status: AC

## 2019-12-27 NOTE — Progress Notes (Signed)

## 2020-01-03 ENCOUNTER — Ambulatory Visit
Admission: RE | Admit: 2020-01-03 | Discharge: 2020-01-03 | Disposition: A | Discharge status: Home / Self Care | Payer: Managed Care, Other (non HMO) | Source: Ambulatory Visit | Attending: Nurse Practitioner | Admitting: Nurse Practitioner

## 2020-01-03 ENCOUNTER — Other Ambulatory Visit: Payer: Self-pay

## 2020-01-03 DIAGNOSIS — Z0001 Encounter for general adult medical examination with abnormal findings: Secondary | ICD-10-CM

## 2020-01-03 DIAGNOSIS — Z1231 Encounter for screening mammogram for malignant neoplasm of breast: Secondary | ICD-10-CM

## 2020-01-06 ENCOUNTER — Encounter: Payer: Self-pay | Admitting: Gastroenterology

## 2020-01-10 ENCOUNTER — Other Ambulatory Visit: Payer: Self-pay

## 2020-01-10 ENCOUNTER — Ambulatory Visit (AMBULATORY_SURGERY_CENTER): Payer: Managed Care, Other (non HMO) | Admitting: Gastroenterology

## 2020-01-10 ENCOUNTER — Encounter: Payer: Self-pay | Admitting: Gastroenterology

## 2020-01-10 VITALS — BP 117/68 | HR 71 | Temp 97.8°F | Resp 11 | Ht 66.5 in | Wt 285.0 lb

## 2020-01-10 DIAGNOSIS — D122 Benign neoplasm of ascending colon: Secondary | ICD-10-CM | POA: Diagnosis not present

## 2020-01-10 DIAGNOSIS — Z1211 Encounter for screening for malignant neoplasm of colon: Secondary | ICD-10-CM

## 2020-01-10 DIAGNOSIS — Z86010 Personal history of colonic polyps: Secondary | ICD-10-CM

## 2020-01-10 DIAGNOSIS — Z800 Family history of malignant neoplasm of digestive organs: Secondary | ICD-10-CM | POA: Diagnosis not present

## 2020-01-10 MED ORDER — SODIUM CHLORIDE 0.9 % IV SOLN: 500.0000 mL | Freq: Once | INTRAVENOUS | Status: DC

## 2020-01-10 NOTE — Op Note (Signed)
[facility] Patient Name: Kristin Lawrence Procedure Date: 01/10/2020 10:10 AM MRN: [id number] Endoscopist: Mauri Pole , MD Age: 44 Referring MD:  Date of Birth: 02-Sep-1976 Gender: Female Account #: [id number] Procedure:                Colonoscopy Indications:              Screening in patient at increased risk: Family                            history of 1st-degree relative with colorectal                            cancer before age 37 years, High risk colon cancer                            surveillance: Personal history of colonic polyps Medicines:                Monitored Anesthesia Care Procedure:                Pre-Anesthesia Assessment:                           - Prior to the procedure, a History and Physical                            was performed, and patient medications and                            allergies were reviewed. The patient's tolerance of                            previous anesthesia was also reviewed. The risks                            and benefits of the procedure and the sedation                            options and risks were discussed with the patient.                            All questions were answered, and informed consent                            was obtained. Prior Anticoagulants: The patient has                            taken no previous anticoagulant or antiplatelet                            agents. ASA Grade Assessment: III - A patient with                            severe systemic disease. After reviewing the risks  and benefits, the patient was deemed in                            satisfactory condition to undergo the procedure.                           After obtaining informed consent, the colonoscope                            was passed under direct vision. Throughout the                            procedure, the patient's blood pressure, pulse, and                            oxygen  saturations were monitored continuously. The                            Colonoscope was introduced through the anus and                            advanced to the the cecum, identified by                            appendiceal orifice and ileocecal valve. The                            colonoscopy was performed without difficulty. The                            patient tolerated the procedure well. The quality                            of the bowel preparation was excellent. The                            ileocecal valve, appendiceal orifice, and rectum                            were photographed. Scope In: 10:14:23 AM Scope Out: 10:33:25 AM Scope Withdrawal Time: 0 hours 6 minutes 48 seconds  Total Procedure Duration: 0 hours 19 minutes 2 seconds  Findings:                 The perianal and digital rectal examinations were                            normal.                           A 2 mm polyp was found in the ascending colon. The                            polyp was sessile. The polyp was removed with a  cold biopsy forceps. Resection and retrieval were                            complete.                           Non-bleeding internal hemorrhoids were found during                            retroflexion. The hemorrhoids were small.                           The exam was otherwise without abnormality. Complications:            No immediate complications. Estimated Blood Loss:     Estimated blood loss was minimal. Impression:               - One 2 mm polyp in the ascending colon, removed                            with a cold biopsy forceps. Resected and retrieved.                           - Non-bleeding internal hemorrhoids.                           - The examination was otherwise normal. Recommendation:           - Patient has a contact number available for                            emergencies. The signs and symptoms of potential                             delayed complications were discussed with the                            patient. Return to normal activities tomorrow.                            Written discharge instructions were provided to the                            patient.                           - Resume previous diet.                           - Continue present medications.                           - Await pathology results.                           - Repeat colonoscopy in 5 years for surveillance  based on pathology results. Mauri Pole, MD 01/10/2020 10:41:53 AM This report has been signed electronically.

## 2020-01-10 NOTE — Progress Notes (Signed)
Temperature taken by L.C., VS taken by K.A. 

## 2020-01-10 NOTE — Progress Notes (Signed)
Pt's states no medical or surgical changes since previsit or office visit. 

## 2020-01-10 NOTE — Patient Instructions (Signed)
Handout provided on polyps and hemorrhoids.   YOU HAD AN ENDOSCOPIC PROCEDURE TODAY AT THE [facility]:   Refer to the procedure report that was given to you for any specific questions about what was found during the examination.  If the procedure report does not answer your questions, please call your gastroenterologist to clarify.  If you requested that your care partner not be given the details of your procedure findings, then the procedure report has been included in a sealed envelope for you to review at your convenience later.  YOU SHOULD EXPECT: Some feelings of bloating in the abdomen. Passage of more gas than usual.  Walking can help get rid of the air that was put into your GI tract during the procedure and reduce the bloating. If you had a lower endoscopy (such as a colonoscopy or flexible sigmoidoscopy) you may notice spotting of blood in your stool or on the toilet paper. If you underwent a bowel prep for your procedure, you may not have a normal bowel movement for a few days.  Please Note:  You might notice some irritation and congestion in your nose or some drainage.  This is from the oxygen used during your procedure.  There is no need for concern and it should clear up in a day or so.  SYMPTOMS TO REPORT IMMEDIATELY:  Following lower endoscopy (colonoscopy or flexible sigmoidoscopy):  Excessive amounts of blood in the stool  Significant tenderness or worsening of abdominal pains  Swelling of the abdomen that is new, acute  Fever of 100F or higher  For urgent or emergent issues, a gastroenterologist can be reached at any hour by calling [phone]. Do not use MyChart messaging for urgent concerns.    DIET:  We do recommend a small meal at first, but then you may proceed to your regular diet.  Drink plenty of fluids but you should avoid alcoholic beverages for 24 hours.  ACTIVITY:  You should plan to take it easy for the rest of today and you should NOT DRIVE or  use heavy machinery until tomorrow (because of the sedation medicines used during the test).    FOLLOW UP: Our staff will call the number listed on your records 48-72 hours following your procedure to check on you and address any questions or concerns that you may have regarding the information given to you following your procedure. If we do not reach you, we will leave a message.  We will attempt to reach you two times.  During this call, we will ask if you have developed any symptoms of COVID 19. If you develop any symptoms (ie: fever, flu-like symptoms, shortness of breath, cough etc.) before then, please call [phone].  If you test positive for Covid 19 in the 2 weeks post procedure, please call and report this information to us.    If any biopsies were taken you will be contacted by phone or by letter within the next 1-3 weeks.  Please call us at [phone] if you have not heard about the biopsies in 3 weeks.    SIGNATURES/CONFIDENTIALITY: You and/or your care partner have signed paperwork which will be entered into your electronic medical record.  These signatures attest to the fact that that the information above on your After Visit Summary has been reviewed and is understood.  Full responsibility of the confidentiality of this discharge information lies with you and/or your care-partner.  

## 2020-01-10 NOTE — Progress Notes (Signed)
Report to PACU, RN, vss, BBS= Clear.  

## 2020-01-10 NOTE — Progress Notes (Signed)
Called to room to assist during endoscopic procedure.  Patient ID and intended procedure confirmed with present staff. Received instructions for my participation in the procedure from the performing physician.  

## 2020-01-12 ENCOUNTER — Telehealth: Payer: Self-pay

## 2020-01-12 NOTE — Telephone Encounter (Signed)
  Follow up Call-  Call back number 01/10/2020  Post procedure Call Back phone  # [phone]  Permission to leave phone message Yes  Some recent data might be hidden     Patient questions:  Do you have a fever, pain , or abdominal swelling? No. Pain Score  0 *  Have you tolerated food without any problems? Yes.    Have you been able to return to your normal activities? Yes.    Do you have any questions about your discharge instructions: Diet   No. Medications  No. Follow up visit  No.  Do you have questions or concerns about your Care? No.  Actions: * If pain score is 4 or above: No action needed, pain <4.  1. Have you developed a fever since your procedure? No  2.   Have you had an respiratory symptoms (SOB or cough) since your procedure? No 3.   Have you tested positive for COVID 19 since your procedure No  4.   Have you had any family members/close contacts diagnosed with the COVID 19 since your procedure?  No   If yes to any of these questions please route to Joylene John, RN and Erenest Rasher, RN

## 2020-01-14 ENCOUNTER — Encounter: Payer: Self-pay | Admitting: Gastroenterology

## 2020-02-28 ENCOUNTER — Telehealth (INDEPENDENT_AMBULATORY_CARE_PROVIDER_SITE_OTHER): Payer: Managed Care, Other (non HMO) | Admitting: Nurse Practitioner

## 2020-02-28 ENCOUNTER — Other Ambulatory Visit: Payer: Self-pay

## 2020-02-28 ENCOUNTER — Encounter: Payer: Self-pay | Admitting: Nurse Practitioner

## 2020-02-28 VITALS — BP 118/93 | HR 73 | Ht 66.5 in | Wt 275.0 lb

## 2020-02-28 DIAGNOSIS — I10 Essential (primary) hypertension: Secondary | ICD-10-CM | POA: Diagnosis not present

## 2020-02-28 MED ORDER — LISINOPRIL-HYDROCHLOROTHIAZIDE 20-25 MG PO TABS: 1.0000 | ORAL_TABLET | Freq: Every day | ORAL | 2 refills | Status: DC

## 2020-02-28 NOTE — Progress Notes (Signed)
Virtual Visit via Video Note  I connected with@ on 02/28/20 at 12:30 PM EDT by a video enabled telemedicine application and verified that I am speaking with the correct person using two identifiers.  Location: Patient:Home Provider: Office Participants: patient and provider  I discussed the limitations of evaluation and management by telemedicine and the availability of in person appointments. I also discussed with the patient that there may be a patient responsible charge related to this service. The patient expressed understanding and agreed to proceed.  CC:HTN f/up  History of Present Illness: HTN: BP at goal No palpitation, no dizziness, no LE edema, no chest pain. BP Readings from Last 3 Encounters:  02/28/20 (!) 118/93  01/10/20 117/68  11/29/19 130/78   Observations/Objective: Physical Exam  Constitutional: She is oriented to person, place, and time. No distress.  Pulmonary/Chest: Effort normal.  Musculoskeletal:        General: No edema.  Neurological: She is alert and oriented to person, place, and time.  Psychiatric: She has a normal mood and affect. Her behavior is normal. Thought content normal.  Vitals reviewed.  Assessment and Plan: Kristin Lawrence was seen today for follow-up.  Diagnoses and all orders for this visit:  HYPERTENSION, BENIGN ESSENTIAL -     lisinopril-hydrochlorothiazide (ZESTORETIC) 20-25 MG tablet; Take 1 tablet by mouth daily.   Follow Up Instructions: Maintain current medications, f/up in 51months   I discussed the assessment and treatment plan with the patient. The patient was provided an opportunity to ask questions and all were answered. The patient agreed with the plan and demonstrated an understanding of the instructions.   The patient was advised to call back or seek an in-person evaluation if the symptoms worsen or if the condition fails to improve as anticipated.  Wilfred Lacy, NP

## 2020-02-28 NOTE — Assessment & Plan Note (Signed)
BP at goal with lisinopril/HCTZ F/up in 44months.

## 2020-12-21 ENCOUNTER — Other Ambulatory Visit: Payer: Self-pay | Admitting: Nurse Practitioner

## 2020-12-21 DIAGNOSIS — I10 Essential (primary) hypertension: Secondary | ICD-10-CM

## 2020-12-29 LAB — HM MAMMOGRAPHY

## 2021-01-18 ENCOUNTER — Encounter: Payer: Self-pay | Admitting: Nurse Practitioner

## 2021-01-29 ENCOUNTER — Other Ambulatory Visit: Payer: Self-pay

## 2021-01-30 ENCOUNTER — Ambulatory Visit: Payer: Managed Care, Other (non HMO) | Admitting: Nurse Practitioner

## 2021-01-31 ENCOUNTER — Ambulatory Visit: Payer: Managed Care, Other (non HMO) | Admitting: Nurse Practitioner

## 2021-02-06 ENCOUNTER — Ambulatory Visit: Payer: Managed Care, Other (non HMO) | Admitting: Nurse Practitioner

## 2021-02-07 ENCOUNTER — Encounter: Payer: Self-pay | Admitting: Nurse Practitioner

## 2021-02-07 ENCOUNTER — Ambulatory Visit: Payer: Managed Care, Other (non HMO) | Admitting: Nurse Practitioner

## 2021-02-07 ENCOUNTER — Other Ambulatory Visit: Payer: Self-pay

## 2021-02-07 VITALS — BP 101/70 | HR 70 | Temp 98.6°F | Ht 66.5 in | Wt 270.0 lb

## 2021-02-07 DIAGNOSIS — I10 Essential (primary) hypertension: Secondary | ICD-10-CM

## 2021-02-07 DIAGNOSIS — M722 Plantar fascial fibromatosis: Secondary | ICD-10-CM | POA: Diagnosis not present

## 2021-02-07 LAB — BASIC METABOLIC PANEL
BUN: 11 mg/dL (ref 6–23)
CO2: 27 mEq/L (ref 19–32)
Calcium: 9.6 mg/dL (ref 8.4–10.5)
Chloride: 100 mEq/L (ref 96–112)
Creatinine, Ser: 0.76 mg/dL (ref 0.40–1.20)
GFR: 95.41 mL/min (ref >60.00)
Glucose, Bld: 101 mg/dL — ABNORMAL HIGH (ref 70–99)
Potassium: 3.7 mEq/L (ref 3.5–5.1)
Sodium: 137 mEq/L (ref 135–145)

## 2021-02-07 MED ORDER — NAPROXEN 500 MG PO TABS: 500.0000 mg | ORAL_TABLET | Freq: Two times a day (BID) | ORAL | 0 refills | Status: DC

## 2021-02-07 MED ORDER — LISINOPRIL-HYDROCHLOROTHIAZIDE 20-25 MG PO TABS: 1.0000 | ORAL_TABLET | Freq: Every day | ORAL | 1 refills | Status: DC

## 2021-02-07 NOTE — Assessment & Plan Note (Signed)
BP at goal with lisinopril/hctz No adverse side effects with medication BP Readings from Last 3 Encounters:  02/07/21 101/70  02/28/20 (!) 118/93  01/10/20 117/68   Repeat BMP: stable renal function and electrolytes Sent medication refill

## 2021-02-07 NOTE — Progress Notes (Signed)
Subjective:  Patient ID: Kristin Lawrence, female    DOB: 1976-02-18  Age: 45 y.o. MRN: [id number]  CC: Acute Visit (Pt c/o lt foot pain x 6 months, worsening over the last 2 months.  Pt explains pain is more constant, in heel area and across the top of foot. Very tender and really hurts when pressure is applied.)  HPI Kristin Lawrence presents with left heel pain x4months, worsening with weight bearing and palpation, worse in morning. Denies any known foot injury. She wears steel toe boot for work, she changes each pair every 44months.  Benign essential hypertension BP at goal with lisinopril/hctz No adverse side effects with medication BP Readings from Last 3 Encounters:  02/07/21 101/70  02/28/20 (!) 118/93  01/10/20 117/68   Repeat BMP: stable renal function and electrolytes Sent medication refill  Reviewed past Medical, Social and Family history today.  Outpatient Medications Prior to Visit  Medication Sig Dispense Refill  . Ferrous Sulfate (IRON PO) Take by mouth.    . FOLIC ACID PO Take by mouth.    Marland Kitchen omeprazole (PRILOSEC OTC) 20 MG tablet Take 20 mg by mouth daily.    Marland Kitchen OVER THE COUNTER MEDICATION Joints support    . ibuprofen (ADVIL) 600 MG tablet Take 600 mg by mouth 4 (four) times daily as needed.    Marland Kitchen lisinopril-hydrochlorothiazide (ZESTORETIC) 20-25 MG tablet Take 1 tablet by mouth once daily 90 tablet 0  . meloxicam (MOBIC) 15 MG tablet SMARTSIG:.5 Tablet(s) By Mouth Daily     No facility-administered medications prior to visit.    ROS See HPI  Objective:  BP 101/70 (BP Location: Left Arm, Patient Position: Sitting, Cuff Size: Large)   Pulse 70   Temp 98.6 F (37 C) (Oral)   Ht 5' 6.5" (1.689 m)   Wt 270 lb (122.5 kg)   LMP 01/20/2021   SpO2 99%   BMI 42.93 kg/m   Physical Exam Constitutional:      Appearance: She is obese.  Cardiovascular:     Rate and Rhythm: Normal rate.     Pulses: Normal pulses.  Pulmonary:     Effort: Pulmonary effort is normal.   Musculoskeletal:        General: Tenderness present. No swelling or deformity.     Right lower leg: No edema.     Left lower leg: No edema.     Right ankle: Normal.     Right Achilles Tendon: Normal.     Left ankle: Normal.     Left Achilles Tendon: Normal.     Right foot: Normal.     Left foot: Normal range of motion. Tenderness present. No swelling, bunion or prominent metatarsal heads.       Feet:  Skin:    Findings: No erythema.  Neurological:     Mental Status: She is alert and oriented to person, place, and time.    Assessment & Plan:  This visit occurred during the SARS-CoV-2 public health emergency.  Safety protocols were in place, including screening questions prior to the visit, additional usage of staff PPE, and extensive cleaning of exam room while observing appropriate contact time as indicated for disinfecting solutions.   [name] was seen today for acute visit.  Diagnoses and all orders for this visit:  Plantar fasciitis of left foot -     naproxen (NAPROSYN) 500 MG tablet; Take 1 tablet (500 mg total) by mouth 2 (two) times daily with a meal.  Benign essential hypertension -  Basic metabolic panel -     lisinopril-hydrochlorothiazide (ZESTORETIC) 20-25 MG tablet; Take 1 tablet by mouth daily.  Stop meloxicam and ibuprofen while taking naproxen. Start naproxen twice a day, take with food Apply cold compress every morning Start daily foot exercise Use heel and arch support in your shoes. Call office if no improvement in 4-6weeks.  Problem List Items Addressed This Visit      Cardiovascular and Mediastinum   Benign essential hypertension    BP at goal with lisinopril/hctz No adverse side effects with medication BP Readings from Last 3 Encounters:  02/07/21 101/70  02/28/20 (!) 118/93  01/10/20 117/68   Repeat BMP: stable renal function and electrolytes Sent medication refill      Relevant Medications   lisinopril-hydrochlorothiazide (ZESTORETIC)  20-25 MG tablet   Other Relevant Orders   Basic metabolic panel (Completed)    Other Visit Diagnoses    Plantar fasciitis of left foot    -  Primary   Relevant Medications   naproxen (NAPROSYN) 500 MG tablet      Follow-up: Return in about 5 months (around 07/10/2021) for CPE (breast and pelvic exam, fasting).  Kristin Lacy, NP

## 2021-02-07 NOTE — Patient Instructions (Addendum)
Go to lab for repeat BMP Stop meloxicam and ibuprofen while taking naproxen.  Start naproxen twice a day, take with food Apply cold compress every morning Start daily foot exercise Use heel and arch support in your shoes. Call office if no improvement in 4-6weeks.  Plantar Fasciitis Rehab Ask your health care provider which exercises are safe for you. Do exercises exactly as told by your health care provider and adjust them as directed. It is normal to feel mild stretching, pulling, tightness, or discomfort as you do these exercises. Stop right away if you feel sudden pain or your pain gets worse. Do not begin these exercises until told by your health care provider. Stretching and range-of-motion exercises These exercises warm up your muscles and joints and improve the movement and flexibility of your foot. These exercises also help to relieve pain. Plantar fascia stretch 1. Sit with your left / right leg crossed over your opposite knee. 2. Hold your heel with one hand with that thumb near your arch. With your other hand, hold your toes and gently pull them back toward the top of your foot. You should feel a stretch on the base (bottom) of your toes, or the bottom of your foot (plantar fascia), or both. 3. Hold this stretch for__________ seconds. 4. Slowly release your toes and return to the starting position. Repeat __________ times. Complete this exercise __________ times a day.   Gastrocnemius stretch, standing This exercise is also called a calf (gastroc) stretch. It stretches the muscles in the back of the upper calf. 1. Stand with your hands against a wall. 2. Extend your left / right leg behind you, and bend your front knee slightly. 3. Keeping your heels on the floor, your toes facing forward, and your back knee straight, shift your weight toward the wall. Do not arch your back. You should feel a gentle stretch in your upper calf. 4. Hold this position for __________ seconds. Repeat  __________ times. Complete this exercise __________ times a day.   Soleus stretch, standing This exercise is also called a calf (soleus) stretch. It stretches the muscles in the back of the lower calf. 1. Stand with your hands against a wall. 2. Extend your left / right leg behind you, and bend your front knee slightly. 3. Keeping your heels on the floor and your toes facing forward, bend your back knee and shift your weight slightly over your back leg. You should feel a gentle stretch deep in your lower calf. 4. Hold this position for __________ seconds. Repeat __________ times. Complete this exercise __________ times a day. Gastroc and soleus stretch, standing step This exercise stretches the muscles in the back of the lower leg. These muscles are in the upper calf (gastrocnemius) and the lower calf (soleus). 1. Stand with the ball of your left / right foot on the front of a step. The ball of your foot is on the walking surface, right under your toes. 2. Keep your other foot firmly on the same step. 3. Hold on to the wall or a railing for balance. 4. Slowly lift your other foot, allowing your body weight to press your heel down over the edge of the front of the step. Keep knee straight and unbent. You should feel a stretch in your calf. 5. Hold this position for __________ seconds. 6. Return both feet to the step. 7. Repeat this exercise with a slight bend in your left / right knee. Repeat __________ times with your left /  right knee straight and __________ times with your left / right knee bent. Complete this exercise __________ times a day. Balance exercise This exercise builds your balance and strength control of your arch to help take pressure off your plantar fascia. Single leg stand If this exercise is too easy, you can try it with your eyes closed or while standing on a pillow. 1. Without shoes, stand near a railing or in a doorway. You may hold on to the railing or door frame as  needed. 2. Stand on your left / right foot. Keep your big toe down on the floor and lift the arch of your foot. You should feel a stretch across the bottom of your foot and your arch. Do not let your foot roll inward. 3. Hold this position for __________ seconds. Repeat __________ times. Complete this exercise __________ times a day. This information is not intended to replace advice given to you by your health care provider. Make sure you discuss any questions you have with your health care provider. Document Revised: 07/20/2020 Document Reviewed: 07/20/2020 Elsevier Patient Education  [place].

## 2021-03-20 ENCOUNTER — Telehealth: Payer: Self-pay | Admitting: Nurse Practitioner

## 2021-03-20 DIAGNOSIS — M722 Plantar fascial fibromatosis: Secondary | ICD-10-CM

## 2021-03-20 DIAGNOSIS — R600 Localized edema: Secondary | ICD-10-CM

## 2021-03-20 NOTE — Telephone Encounter (Signed)
Patient states that her foot is not feeling any better so she would like to move forward with the referral that was discussed. Please call her back at [phone] if you have any questions.

## 2021-03-21 NOTE — Telephone Encounter (Signed)
Ok to place podiatry referral

## 2021-03-25 ENCOUNTER — Other Ambulatory Visit: Payer: Self-pay | Admitting: Nurse Practitioner

## 2021-03-25 DIAGNOSIS — I10 Essential (primary) hypertension: Secondary | ICD-10-CM

## 2021-03-27 ENCOUNTER — Other Ambulatory Visit: Payer: Self-pay | Admitting: Nurse Practitioner

## 2021-03-27 DIAGNOSIS — M722 Plantar fascial fibromatosis: Secondary | ICD-10-CM

## 2021-03-29 ENCOUNTER — Other Ambulatory Visit: Payer: Self-pay

## 2021-03-29 ENCOUNTER — Ambulatory Visit (INDEPENDENT_AMBULATORY_CARE_PROVIDER_SITE_OTHER): Payer: Managed Care, Other (non HMO)

## 2021-03-29 ENCOUNTER — Ambulatory Visit: Payer: Managed Care, Other (non HMO) | Admitting: Podiatry

## 2021-03-29 ENCOUNTER — Encounter: Payer: Self-pay | Admitting: Podiatry

## 2021-03-29 DIAGNOSIS — M722 Plantar fascial fibromatosis: Secondary | ICD-10-CM

## 2021-03-29 MED ORDER — TRIAMCINOLONE ACETONIDE 10 MG/ML IJ SUSP
10.0000 mg | Freq: Once | INTRAMUSCULAR | Status: AC
  Administered 2021-03-29: 10 mg

## 2021-03-29 MED ORDER — DICLOFENAC SODIUM 75 MG PO TBEC: 75.0000 mg | DELAYED_RELEASE_TABLET | Freq: Two times a day (BID) | ORAL | 2 refills | Status: DC

## 2021-03-29 NOTE — Patient Instructions (Signed)

## 2021-04-03 NOTE — Progress Notes (Signed)
Subjective:   Patient ID: Kristin Lawrence, female   DOB: 45 y.o.   MRN: [id number]   HPI Patient states she has significant discomfort in the left plantar heel and states its been sore and making it hard for her to walk.  Patient states this is been ongoing and she does not remember injury and does not smoke likes to be active   Review of Systems  All other systems reviewed and are negative.      Objective:  Physical Exam Vitals and nursing note reviewed.  Constitutional:      Appearance: She is well-developed.  Pulmonary:     Effort: Pulmonary effort is normal.  Musculoskeletal:        General: Normal range of motion.  Skin:    General: Skin is warm.  Neurological:     Mental Status: She is alert.    Neurovascular status intact muscle strength adequate range of motion adequate.  Patient is found to have exquisite discomfort in the plantar aspect of the left heel at the insertional point of the tendon into the calcaneus with inflammation fluid around the medial band.  Patient is noted to have good digital perfusion well oriented x3     Assessment:  Acute Planter fasciitis left with inflammation fluid buildup     Plan:  H&P reviewed condition and went ahead today did sterile prep and injected the plantar fascial left 3 mg Kenalog 5 mg Xylocaine applied fascial brace gave instructions for stretches shoe gear modification reappoint to recheck  X-rays indicate small spur no indication stress fracture arthritis

## 2021-04-12 ENCOUNTER — Ambulatory Visit: Payer: Managed Care, Other (non HMO) | Admitting: Podiatry

## 2021-04-12 ENCOUNTER — Other Ambulatory Visit: Payer: Self-pay

## 2021-04-12 ENCOUNTER — Encounter: Payer: Self-pay | Admitting: Podiatry

## 2021-04-12 DIAGNOSIS — M722 Plantar fascial fibromatosis: Secondary | ICD-10-CM | POA: Diagnosis not present

## 2021-04-13 NOTE — Progress Notes (Signed)
Subjective:   Patient ID: Kristin Lawrence, female   DOB: 45 y.o.   MRN: [id number]   HPI Patient presents stating that she is doing better with her pain but does get pain when she gets up in the morning and after periods of sitting.  Patient has not truly tested this yet and is trying to get more active but has not been following that course at this particular juncture   ROS      Objective:  Physical Exam  Neurovascular status intact moderate flatfoot deformity noted mild discomfort only upon deep palpation to the plantar fascial with a natural tight plantar fascial and equinus condition     Assessment:  Planter fasciitis left improving still present left     Plan:  H&P reviewed at great length the continuation of anti-inflammatories stretching exercises and dispensed night splint for consistent stretching along with aggressive ice therapy which was explained to her at this time.  I also recommended topicals as needed and shoe gear modifications and patient will be seen back as indicated and I have encouraged her to resume her normal activities

## 2021-04-23 ENCOUNTER — Other Ambulatory Visit: Payer: Self-pay | Admitting: Nurse Practitioner

## 2021-04-23 DIAGNOSIS — M722 Plantar fascial fibromatosis: Secondary | ICD-10-CM

## 2021-05-20 IMAGING — MG DIGITAL SCREENING BILAT W/ CAD
4 series · 4 of 4 positions shown · non-contrast
Comparison: Previous exam(s).

CLINICAL DATA: Screening.

EXAM:
DIGITAL SCREENING BILATERAL MAMMOGRAM WITH CAD

[L CC]
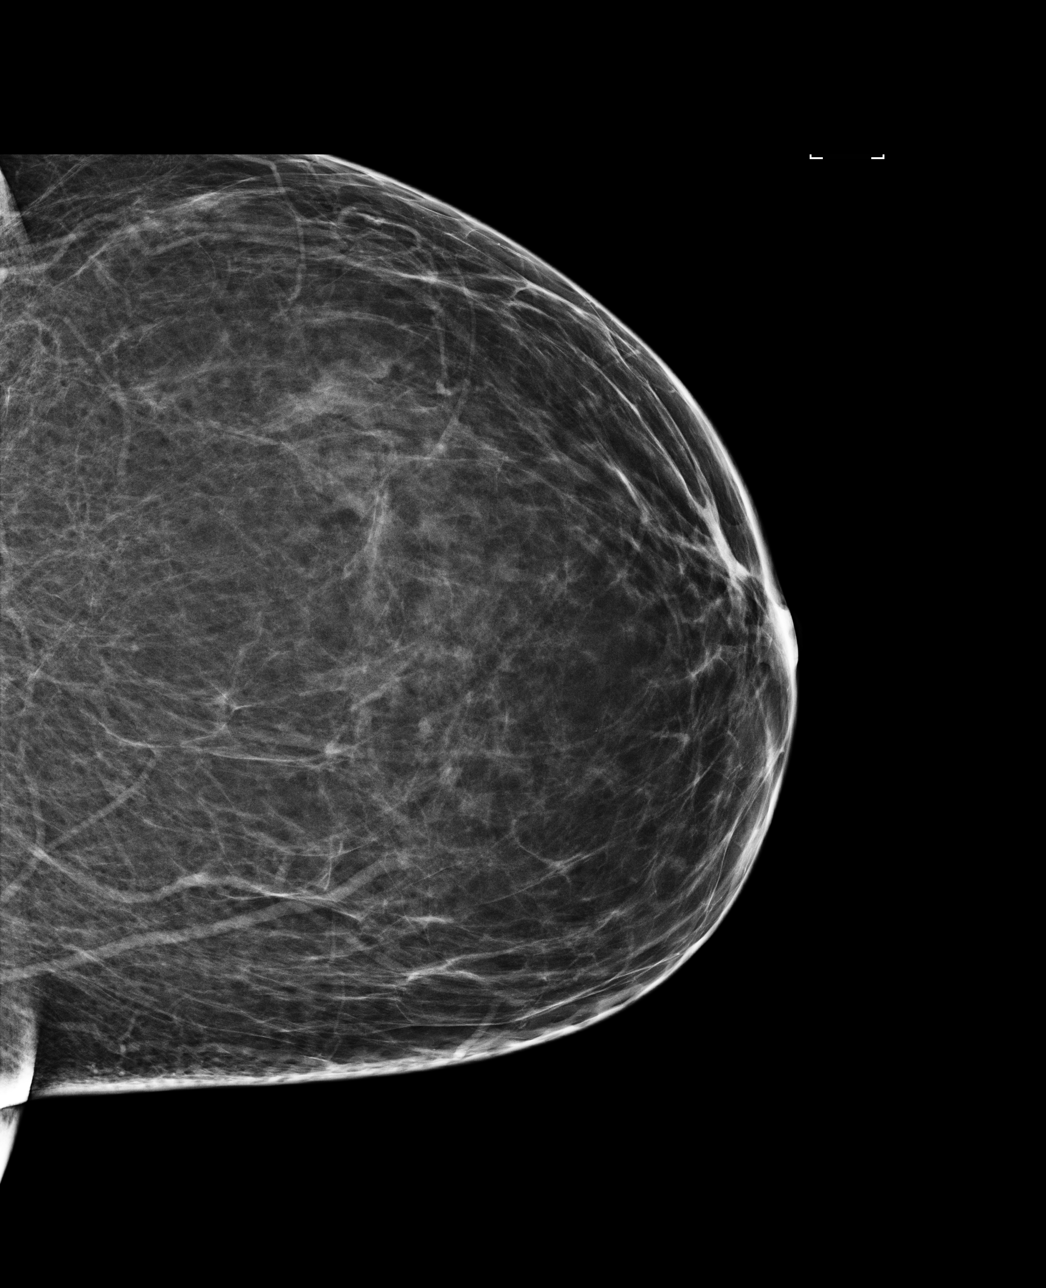

[R MLO]
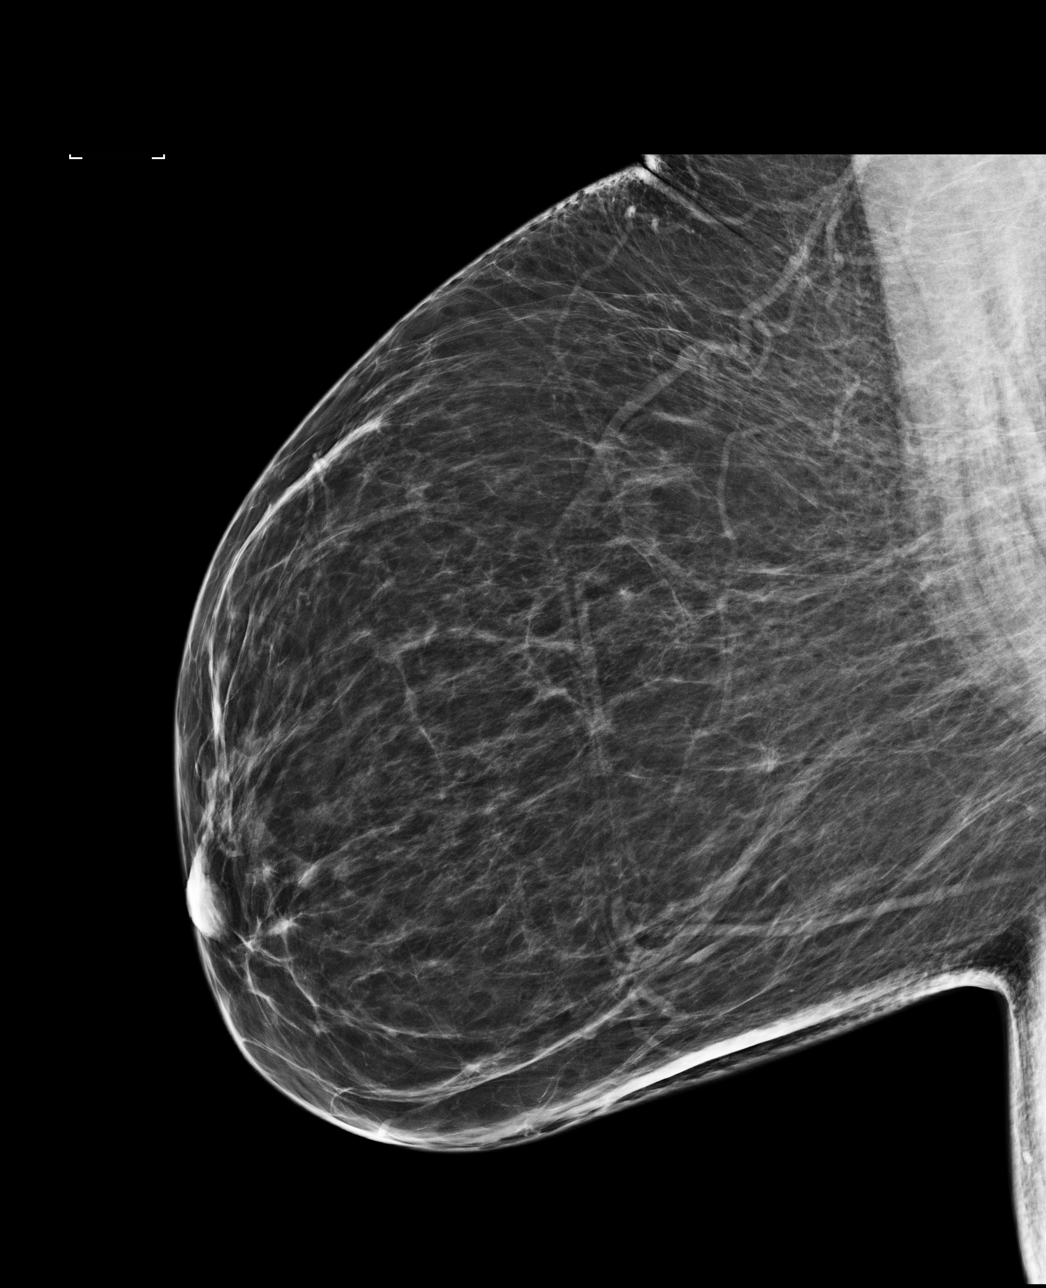

[R CC]
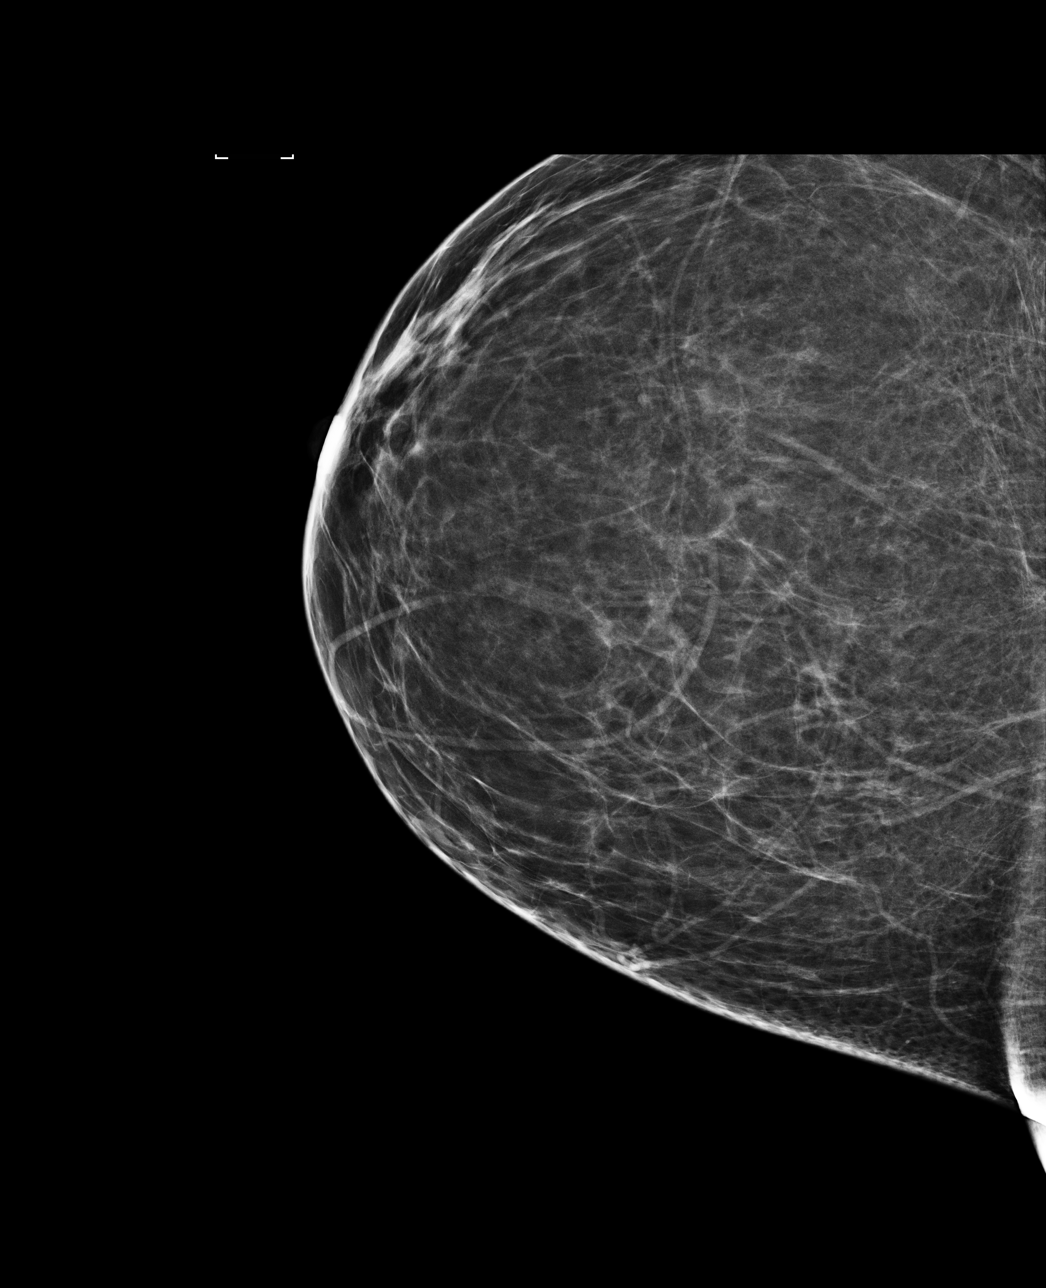

[L MLO]
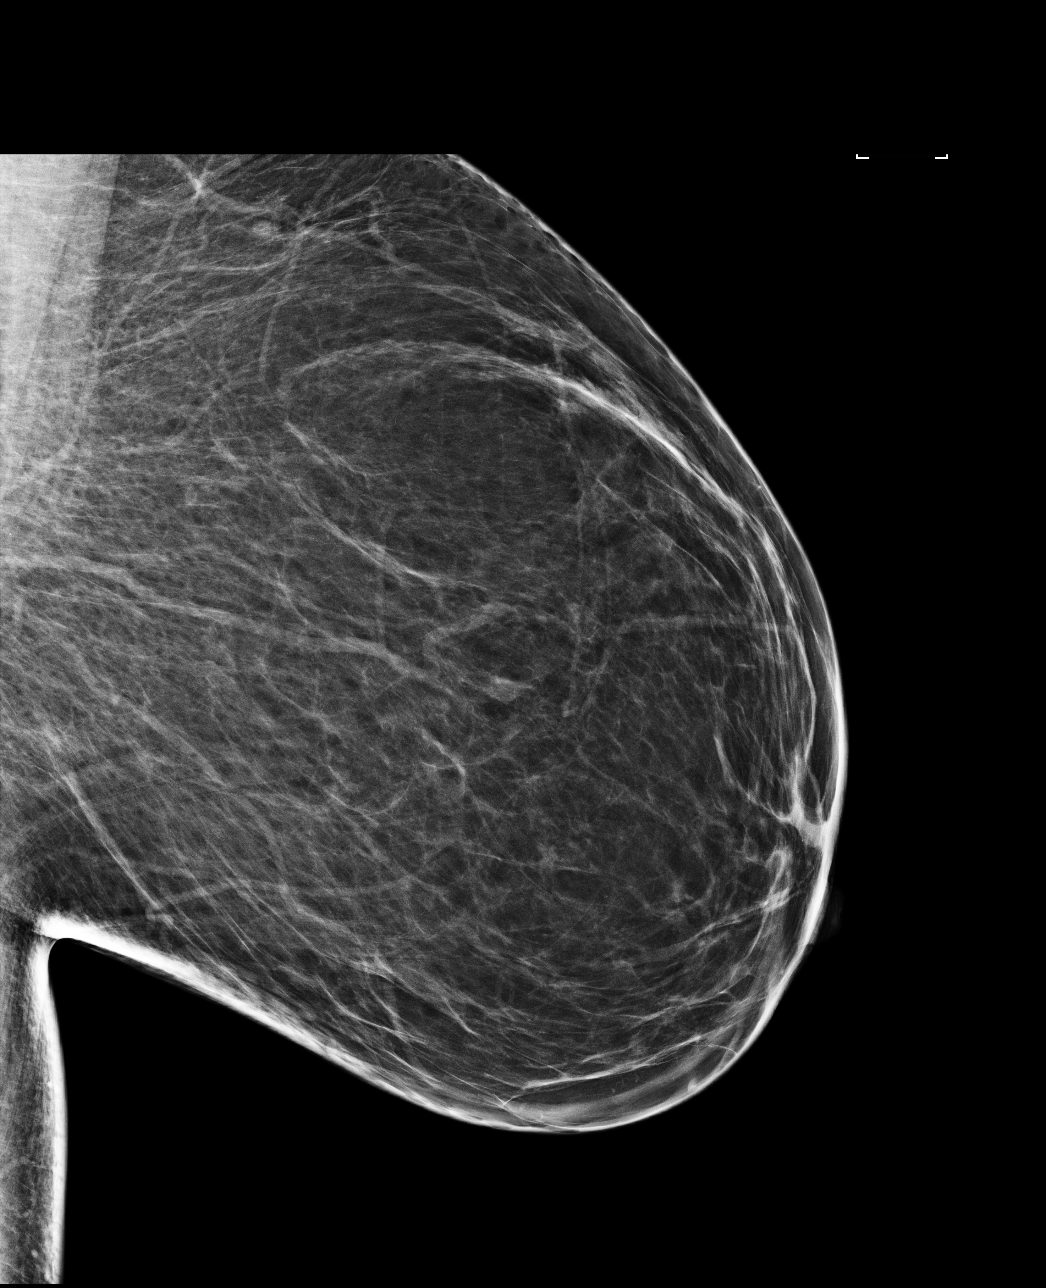

[4 of 4 positions shown; findings below may reference images not displayed]

ACR Breast Density Category b: There are scattered areas of
fibroglandular density.
FINDINGS: There are no findings suspicious for malignancy. Images were
processed with CAD.
IMPRESSION: No mammographic evidence of malignancy. A result letter of this
screening mammogram will be mailed directly to the patient.

RECOMMENDATION:
Screening mammogram in one year. (Code:AS-G-LCT)

BI-RADS CATEGORY  1: Negative.

## 2021-07-10 ENCOUNTER — Other Ambulatory Visit: Payer: Self-pay

## 2021-07-11 ENCOUNTER — Other Ambulatory Visit (HOSPITAL_COMMUNITY)
Admission: RE | Admit: 2021-07-11 | Discharge: 2021-07-11 | Disposition: A | Discharge status: Home / Self Care | Payer: Managed Care, Other (non HMO) | Source: Ambulatory Visit | Attending: Nurse Practitioner | Admitting: Nurse Practitioner

## 2021-07-11 ENCOUNTER — Encounter: Payer: Self-pay | Admitting: Nurse Practitioner

## 2021-07-11 ENCOUNTER — Ambulatory Visit (INDEPENDENT_AMBULATORY_CARE_PROVIDER_SITE_OTHER): Payer: Managed Care, Other (non HMO) | Admitting: Nurse Practitioner

## 2021-07-11 VITALS — BP 114/78 | HR 73 | Temp 96.8°F | Ht 66.0 in | Wt 265.8 lb

## 2021-07-11 DIAGNOSIS — Z124 Encounter for screening for malignant neoplasm of cervix: Secondary | ICD-10-CM | POA: Diagnosis not present

## 2021-07-11 DIAGNOSIS — E876 Hypokalemia: Secondary | ICD-10-CM | POA: Diagnosis not present

## 2021-07-11 DIAGNOSIS — Z112 Encounter for screening for other bacterial diseases: Secondary | ICD-10-CM | POA: Diagnosis not present

## 2021-07-11 DIAGNOSIS — T502X5A Adverse effect of carbonic-anhydrase inhibitors, benzothiadiazides and other diuretics, initial encounter: Secondary | ICD-10-CM

## 2021-07-11 DIAGNOSIS — R102 Pelvic and perineal pain unspecified side: Secondary | ICD-10-CM

## 2021-07-11 DIAGNOSIS — I10 Essential (primary) hypertension: Secondary | ICD-10-CM | POA: Diagnosis not present

## 2021-07-11 DIAGNOSIS — Z13220 Encounter for screening for lipoid disorders: Secondary | ICD-10-CM

## 2021-07-11 DIAGNOSIS — R739 Hyperglycemia, unspecified: Secondary | ICD-10-CM | POA: Diagnosis not present

## 2021-07-11 DIAGNOSIS — Z0000 Encounter for general adult medical examination without abnormal findings: Secondary | ICD-10-CM | POA: Insufficient documentation

## 2021-07-11 DIAGNOSIS — Z136 Encounter for screening for cardiovascular disorders: Secondary | ICD-10-CM

## 2021-07-11 DIAGNOSIS — Z0001 Encounter for general adult medical examination with abnormal findings: Secondary | ICD-10-CM | POA: Diagnosis not present

## 2021-07-11 DIAGNOSIS — D500 Iron deficiency anemia secondary to blood loss (chronic): Secondary | ICD-10-CM | POA: Diagnosis not present

## 2021-07-11 LAB — CBC WITH DIFFERENTIAL/PLATELET
Basophils Absolute: 0 K/uL (ref 0.0–0.1)
Basophils Relative: 0.7 % (ref 0.0–3.0)
Eosinophils Absolute: 0.1 K/uL (ref 0.0–0.7)
Eosinophils Relative: 0.7 % (ref 0.0–5.0)
HCT: 33.1 % — ABNORMAL LOW (ref 36.0–46.0)
Hemoglobin: 11 g/dL — ABNORMAL LOW (ref 12.0–15.0)
Lymphocytes Relative: 30 % (ref 12.0–46.0)
Lymphs Abs: 2.2 K/uL (ref 0.7–4.0)
MCHC: 33.2 g/dL (ref 30.0–36.0)
MCV: 90.1 fl (ref 78.0–100.0)
Monocytes Absolute: 0.3 K/uL (ref 0.1–1.0)
Monocytes Relative: 4.7 % (ref 3.0–12.0)
Neutro Abs: 4.8 K/uL (ref 1.4–7.7)
Neutrophils Relative %: 63.9 % (ref 43.0–77.0)
Platelets: 385 K/uL (ref 150.0–400.0)
RBC: 3.67 Mil/uL — ABNORMAL LOW (ref 3.87–5.11)
RDW: 14.1 % (ref 11.5–15.5)
WBC: 7.5 K/uL (ref 4.0–10.5)

## 2021-07-11 LAB — COMPREHENSIVE METABOLIC PANEL WITH GFR
ALT: 16 U/L (ref 0–35)
AST: 20 U/L (ref 0–37)
Albumin: 4.2 g/dL (ref 3.5–5.2)
Alkaline Phosphatase: 75 U/L (ref 39–117)
BUN: 13 mg/dL (ref 6–23)
CO2: 28 meq/L (ref 19–32)
Calcium: 9.2 mg/dL (ref 8.4–10.5)
Chloride: 100 meq/L (ref 96–112)
Creatinine, Ser: 0.81 mg/dL (ref 0.40–1.20)
GFR: 88.13 mL/min
Glucose, Bld: 97 mg/dL (ref 70–99)
Potassium: 3.4 meq/L — ABNORMAL LOW (ref 3.5–5.1)
Sodium: 138 meq/L (ref 135–145)
Total Bilirubin: 0.3 mg/dL (ref 0.2–1.2)
Total Protein: 7.5 g/dL (ref 6.0–8.3)

## 2021-07-11 LAB — LIPID PANEL
Cholesterol: 195 mg/dL (ref 0–200)
HDL: 62.8 mg/dL (ref >39.00)
LDL Cholesterol: 114 mg/dL — ABNORMAL HIGH (ref 0–99)
NonHDL: 131.7
Total CHOL/HDL Ratio: 3
Triglycerides: 87 mg/dL (ref 0.0–149.0)
VLDL: 17.4 mg/dL (ref 0.0–40.0)

## 2021-07-11 LAB — TSH: TSH: 2.84 u[IU]/mL (ref 0.35–5.50)

## 2021-07-11 LAB — HEMOGLOBIN A1C: Hgb A1c MFr Bld: 6.2 % (ref 4.6–6.5)

## 2021-07-11 MED ORDER — DOXYCYCLINE HYCLATE 100 MG PO TABS
100.0000 mg | ORAL_TABLET | Freq: Two times a day (BID) | ORAL | 0 refills | Status: AC
Start: 2021-07-11 — End: 2021-07-25

## 2021-07-11 MED ORDER — METRONIDAZOLE 500 MG PO TABS: 500.0000 mg | ORAL_TABLET | Freq: Two times a day (BID) | ORAL | 0 refills | Status: AC

## 2021-07-11 MED ORDER — LISINOPRIL-HYDROCHLOROTHIAZIDE 20-25 MG PO TABS: 1.0000 | ORAL_TABLET | Freq: Every day | ORAL | 1 refills | Status: DC

## 2021-07-11 NOTE — Assessment & Plan Note (Signed)
BP at goal with lisinopril/hctz BP Readings from Last 3 Encounters:  07/11/21 114/78  02/07/21 101/70  02/28/20 (!) 118/93   Maintain current dose Check CMP

## 2021-07-11 NOTE — Progress Notes (Signed)
Subjective:    Patient ID: Kristin Lawrence, female    DOB: 1976/05/07, 45 y.o.   MRN: [id number]  Patient presents today for CPE and eval of chronic/acute conditions  Pelvic Pain The patient's primary symptoms include pelvic pain and vaginal bleeding. The patient's pertinent negatives include no genital itching, genital lesions, genital odor, genital rash, missed menses or vaginal discharge. This is a new problem. The current episode started 1 to 4 weeks ago. The problem occurs constantly. The problem has been unchanged. The pain is moderate. The problem affects the left side. She is not pregnant. Associated symptoms include abdominal pain and painful intercourse. Pertinent negatives include no anorexia, back pain, chills, constipation, diarrhea, discolored urine, dysuria, fever, flank pain, frequency, headaches, hematuria, joint pain, joint swelling, nausea, rash, sore throat, urgency or vomiting. The vaginal discharge was normal. The vaginal bleeding is heavier than menses. She has been passing clots. She has not been passing tissue. The symptoms are aggravated by intercourse. She has tried nothing for the symptoms.  S/p tubal ligation Hx of PCOS  Benign essential hypertension BP at goal with lisinopril/hctz BP Readings from Last 3 Encounters:  07/11/21 114/78  02/07/21 101/70  02/28/20 (!) 118/93   Maintain current dose Check CMP  Vision:upcoming appt Dental:upcoming appt Diet:regular Exercise:none Weight:  Wt Readings from Last 3 Encounters:  07/11/21 265 lb 12.8 oz (120.6 kg)  02/07/21 270 lb (122.5 kg)  02/28/20 275 lb (124.7 kg)    Sexual History (orientation,birth control, marital status, STD):sexually active, agreed to STD screen today due to pelvic pain, up to date with mammogram, PAP completed today  Depression/Suicide: Depression screen [facility] 2/9 07/11/2021 02/28/2020 11/29/2019 07/16/2018 01/31/2017 10/10/2014 06/17/2012  Decreased Interest 0 0 0 0 0 0 0  Down, Depressed,  Hopeless 0 0 0 0 0 0 0  PHQ - 2 Score 0 0 0 0 0 0 0   No flowsheet data found.  Immunizations: (TDAP, Hep C screen, Pneumovax, Influenza, zoster)  Health Maintenance  Topic Date Due   Hepatitis C Screening: USPSTF Recommendation to screen - Ages 20-79 yo.  Never done   COVID-19 Vaccine (2 - Pfizer series) 03/02/2020   Pap Smear  07/16/2021   Flu Shot  01/18/2022*   Colon Cancer Screening  01/09/2025   Tetanus Vaccine  11/28/2029   HIV Screening  Completed   HPV Vaccine  Aged Out  *Topic was postponed. The date shown is not the original due date.   Fall Risk: Fall Risk  02/28/2020 11/29/2019  Falls in the past year? 0 0  Number falls in past yr: 0 -  Injury with Fall? 0 -   Medications and allergies reviewed with patient and updated if appropriate.  Patient Active Problem List   Diagnosis Date Noted   Iron deficiency anemia due to chronic blood loss 11/30/2019   Hyperglycemia 11/30/2019   Lower extremity edema 10/10/2014   Diuretic-induced hypokalemia 01/26/2014   Mass of hard palate 09/22/2012   Polyp of colon 06/17/2012   PCOS (polycystic ovarian syndrome) 35/00/9381   Helicobacter pylori infection 02/07/2010   Benign essential hypertension 07/15/2007   Morbid obesity with BMI of 45.0-49.9, adult ([place]) 12/18/2006   Tension type headache 12/18/2006   GASTROESOPHAGEAL REFLUX, NO ESOPHAGITIS 12/18/2006    Current Outpatient Medications on File Prior to Visit  Medication Sig Dispense Refill   Ferrous Sulfate (IRON PO) Take by mouth.     FOLIC ACID PO Take by mouth.     omeprazole (PRILOSEC OTC) 20  MG tablet Take 20 mg by mouth daily.     Potassium 99 MG TABS Take by mouth.     No current facility-administered medications on file prior to visit.    Past Medical History:  Diagnosis Date   Anemia    Anxiety    Benign hypertension    Galactorrhea    GERD (gastroesophageal reflux disease)    Helicobacter pylori ab+ 12/11/09   Per Dr Rayetta Pigg note   IBS (irritable  bowel syndrome)    Liver hemangioma    Obesity    Sleep apnea     Past Surgical History:  Procedure Laterality Date   NASAL SINUS SURGERY     twice 2019 and 2011   TUBAL LIGATION      Social History   Socioeconomic History   Marital status: Married    Spouse name: Not on file   Number of children: Not on file   Years of education: Not on file   Highest education level: Not on file  Occupational History   Occupation: Programmer, multimedia   Tobacco Use   Smoking status: Never   Smokeless tobacco: Never  Vaping Use   Vaping Use: Never used  Substance and Sexual Activity   Alcohol use: Yes    Alcohol/week: 14.0 standard drinks    Types: 14 Shots of liquor per week    Comment: 2-3 a week   Drug use: No   Sexual activity: Yes    Birth control/protection: Surgical  Other Topics Concern   Not on file  Social History Narrative   Works at [employer] as a Merchant navy officer.  Lives with husband, Same sex domestic partner, and two sons 80, 51.   Daily caffeine    Social Determinants of Health   Financial Resource Strain: Not on file  Food Insecurity: Not on file  Transportation Needs: Not on file  Physical Activity: Not on file  Stress: Not on file  Social Connections: Not on file    Family History  Problem Relation Age of Onset   Heart disease Mother 15       MI?   Colon cancer Mother 62   Cystic fibrosis Mother    Anuerysm Mother    Colon polyps Mother    Stroke Maternal Uncle    Stroke Paternal Uncle    Pancreatic cancer Maternal Grandmother    Diabetes Maternal Grandmother    Esophageal cancer Maternal Uncle    Liver cancer Maternal Uncle    Stomach cancer Paternal Grandfather    Breast cancer Paternal Aunt        20's   Rectal cancer Neg Hx        Review of Systems  Constitutional:  Negative for chills, fever, malaise/fatigue and weight loss.  HENT:  Negative for congestion and sore throat.   Eyes:        Negative for visual changes  Respiratory:  Negative  for cough and shortness of breath.   Cardiovascular:  Negative for chest pain, palpitations and leg swelling.  Gastrointestinal:  Positive for abdominal pain. Negative for anorexia, blood in stool, constipation, diarrhea, heartburn, nausea and vomiting.  Genitourinary:  Positive for pelvic pain. Negative for dysuria, flank pain, frequency, hematuria, missed menses, urgency and vaginal discharge.  Musculoskeletal:  Negative for back pain, falls, joint pain and myalgias.  Skin:  Negative for rash.  Neurological:  Negative for dizziness, sensory change and headaches.  Endo/Heme/Allergies:  Does not bruise/bleed easily.  Psychiatric/Behavioral:  Negative for depression, substance abuse  and suicidal ideas. The patient is not nervous/anxious.    Objective:   Vitals:   07/11/21 0844  BP: 114/78  Pulse: 73  Temp: (!) 96.8 F (36 C)  SpO2: 98%   Body mass index is 42.9 kg/m.  Physical Examination:  Physical Exam Vitals reviewed. Exam conducted with a chaperone present.  Constitutional:      General: She is not in acute distress.    Appearance: She is obese.  HENT:     Right Ear: Tympanic membrane, ear canal and external ear normal.     Left Ear: Tympanic membrane, ear canal and external ear normal.     Nose: Nose normal.  Eyes:     General: No scleral icterus.    Extraocular Movements: Extraocular movements intact.     Conjunctiva/sclera: Conjunctivae normal.  Cardiovascular:     Rate and Rhythm: Normal rate and regular rhythm.     Pulses: Normal pulses.     Heart sounds: Normal heart sounds.  Pulmonary:     Effort: Pulmonary effort is normal. No respiratory distress.     Breath sounds: Normal breath sounds.  Chest:     Comments: Declined breast exam Abdominal:     General: There is no distension.     Palpations: Abdomen is soft.     Hernia: There is no hernia in the left inguinal area or right inguinal area.  Genitourinary:    General: Normal vulva.     Exam position:  Lithotomy position.     Labia:        Right: No rash, tenderness or lesion.        Left: No rash, tenderness or lesion.      Urethra: Prolapse present. No urethral pain or urethral swelling.     Vagina: No signs of injury. No vaginal discharge, erythema, tenderness or bleeding.     Cervix: Cervical motion tenderness and discharge present. No friability, lesion, erythema or cervical bleeding.     Uterus: Normal.      Adnexa: Right adnexa normal.       Left: Tenderness present. No mass or fullness.    Musculoskeletal:        General: Normal range of motion.     Cervical back: Normal range of motion and neck supple.     Right lower leg: No edema.     Left lower leg: No edema.  Lymphadenopathy:     Cervical: No cervical adenopathy.     Lower Body: No right inguinal adenopathy. Left inguinal adenopathy present.  Skin:    General: Skin is warm and dry.  Neurological:     Mental Status: She is alert and oriented to person, place, and time.  Psychiatric:        Mood and Affect: Mood normal.        Behavior: Behavior normal.        Thought Content: Thought content normal.    ASSESSMENT and PLAN: This visit occurred during the SARS-CoV-2 public health emergency.  Safety protocols were in place, including screening questions prior to the visit, additional usage of staff PPE, and extensive cleaning of exam room while observing appropriate contact time as indicated for disinfecting solutions.   Chakara was seen today for annual exam.  Diagnoses and all orders for this visit:  Encounter for preventative adult health care exam with abnormal findings -     Cytology - PAP -     Comprehensive metabolic panel -     Lipid panel -  TSH  Benign essential hypertension -     lisinopril-hydrochlorothiazide (ZESTORETIC) 20-25 MG tablet; Take 1 tablet by mouth daily.  Diuretic-induced hypokalemia  Hyperglycemia -     Hemoglobin A1c  Encounter for Papanicolaou smear for cervical cancer  screening -     Cytology - PAP  Encounter for lipid screening for cardiovascular disease -     Lipid panel  Pelvic pain -     HIV Antibody (routine testing w rflx) -     RPR -     Cervicovaginal ancillary only -     Hepatitis C antibody -     doxycycline (VIBRA-TABS) 100 MG tablet; Take 1 tablet (100 mg total) by mouth 2 (two) times daily for 14 days. -     metroNIDAZOLE (FLAGYL) 500 MG tablet; Take 1 tablet (500 mg total) by mouth 2 (two) times daily for 14 days.  Iron deficiency anemia due to chronic blood loss -     CBC with Differential/Platelet -     Iron, TIBC and Ferritin Panel    Possible PID: treated with doxycyline and metronidazole while waiting for lab results. Consider pelvic US if no improvement with oral abx.  Problem List Items Addressed This Visit       Cardiovascular and Mediastinum   Benign essential hypertension    BP at goal with lisinopril/hctz BP Readings from Last 3 Encounters:  07/11/21 114/78  02/07/21 101/70  02/28/20 (!) 118/93   Maintain current dose Check CMP      Relevant Medications   lisinopril-hydrochlorothiazide (ZESTORETIC) 20-25 MG tablet     Other   Diuretic-induced hypokalemia   Relevant Medications   Potassium 99 MG TABS   Hyperglycemia   Relevant Orders   Hemoglobin A1c   Iron deficiency anemia due to chronic blood loss   Relevant Orders   CBC with Differential/Platelet   Iron, TIBC and Ferritin Panel   Other Visit Diagnoses     Encounter for preventative adult health care exam with abnormal findings    -  Primary   Relevant Orders   Cytology - PAP   Comprehensive metabolic panel   Lipid panel   TSH   Encounter for Papanicolaou smear for cervical cancer screening       Relevant Orders   Cytology - PAP   Encounter for lipid screening for cardiovascular disease       Relevant Orders   Lipid panel   Pelvic pain       Relevant Medications   doxycycline (VIBRA-TABS) 100 MG tablet   metroNIDAZOLE (FLAGYL) 500 MG  tablet   Other Relevant Orders   HIV Antibody (routine testing w rflx)   RPR   Cervicovaginal ancillary only   Hepatitis C antibody       Follow up: Return in about 6 months (around 01/08/2022) for HTN and hyperglycemia.  Wilfred Lacy, NP

## 2021-07-11 NOTE — Patient Instructions (Signed)
Go to lab for blood draw  If pelvic pain does not improve after completion of oral abx, call office for pelvic US.  Preventive Care 14-44 Years Old, Female Preventive care refers to lifestyle choices and visits with your health care provider that can promote health and wellness. This includes: A yearly physical exam. This is also called an annual wellness visit. Regular dental and eye exams. Immunizations. Screening for certain conditions. Healthy lifestyle choices, such as: Eating a healthy diet. Getting regular exercise. Not using drugs or products that contain nicotine and tobacco. Limiting alcohol use. What can I expect for my preventive care visit? Physical exam Your health care provider will check your: Height and weight. These may be used to calculate your BMI (body mass index). BMI is a measurement that tells if you are at a healthy weight. Heart rate and blood pressure. Body temperature. Skin for abnormal spots. Counseling Your health care provider may ask you questions about your: Past medical problems. Family's medical history. Alcohol, tobacco, and drug use. Emotional well-being. Home life and relationship well-being. Sexual activity. Diet, exercise, and sleep habits. Work and work Statistician. Access to firearms. Method of birth control. Menstrual cycle. Pregnancy history. What immunizations do I need? Vaccines are usually given at various ages, according to a schedule. Your health care provider will recommend vaccines for you based on your age, medical history, and lifestyle or other factors, such as travel or where you work. What tests do I need? Blood tests Lipid and cholesterol levels. These may be checked every 5 years, or more often if you are over 55 years old. Hepatitis C test. Hepatitis B test. Screening Lung cancer screening. You may have this screening every year starting at age 3 if you have a 30-pack-year history of smoking and currently smoke or  have quit within the past 15 years. Colorectal cancer screening. All adults should have this screening starting at age 71 and continuing until age 106. Your health care provider may recommend screening at age 44 if you are at increased risk. You will have tests every 1-10 years, depending on your results and the type of screening test. Diabetes screening. This is done by checking your blood sugar (glucose) after you have not eaten for a while (fasting). You may have this done every 1-3 years. Mammogram. This may be done every 1-2 years. Talk with your health care provider about when you should start having regular mammograms. This may depend on whether you have a family history of breast cancer. BRCA-related cancer screening. This may be done if you have a family history of breast, ovarian, tubal, or peritoneal cancers. Pelvic exam and Pap test. This may be done every 3 years starting at age 58. Starting at age 36, this may be done every 5 years if you have a Pap test in combination with an HPV test. Other tests STD (sexually transmitted disease) testing, if you are at risk. Bone density scan. This is done to screen for osteoporosis. You may have this scan if you are at high risk for osteoporosis. Talk with your health care provider about your test results, treatment options, and if necessary, the need for more tests. Follow these instructions at home: Eating and drinking  Eat a diet that includes fresh fruits and vegetables, whole grains, lean protein, and low-fat dairy products. Take vitamin and mineral supplements as recommended by your health care provider. Do not drink alcohol if: Your health care provider tells you not to drink. You are pregnant,  may be pregnant, or are planning to become pregnant. If you drink alcohol: Limit how much you have to 0-1 drink a day. Be aware of how much alcohol is in your drink. In the [place] one drink equals one 12 oz bottle of beer (355 mL), one 5 oz  glass of wine (148 mL), or one 1 oz glass of hard liquor (44 mL). Lifestyle Take daily care of your teeth and gums. Brush your teeth every morning and night with fluoride toothpaste. Floss one time each day. Stay active. Exercise for at least 30 minutes 5 or more days each week. Do not use any products that contain nicotine or tobacco, such as cigarettes, e-cigarettes, and chewing tobacco. If you need help quitting, ask your health care provider. Do not use drugs. If you are sexually active, practice safe sex. Use a condom or other form of protection to prevent STIs (sexually transmitted infections). If you do not wish to become pregnant, use a form of birth control. If you plan to become pregnant, see your health care provider for a prepregnancy visit. If told by your health care provider, take low-dose aspirin daily starting at age 67. Find healthy ways to cope with stress, such as: Meditation, yoga, or listening to music. Journaling. Talking to a trusted person. Spending time with friends and family. Safety Always wear your seat belt while driving or riding in a vehicle. Do not drive: If you have been drinking alcohol. Do not ride with someone who has been drinking. When you are tired or distracted. While texting. Wear a helmet and other protective equipment during sports activities. If you have firearms in your house, make sure you follow all gun safety procedures. What's next? Visit your health care provider once a year for an annual wellness visit. Ask your health care provider how often you should have your eyes and teeth checked. Stay up to date on all vaccines. This information is not intended to replace advice given to you by your health care provider. Make sure you discuss any questions you have with your health care provider. Document Revised: 12/15/2020 Document Reviewed: 06/18/2018 Elsevier Patient Education  2022 [employer].

## 2021-07-12 LAB — CERVICOVAGINAL ANCILLARY ONLY
Bacterial Vaginitis (gardnerella): POSITIVE — AB
Candida Glabrata: NEGATIVE
Candida Vaginitis: NEGATIVE
Chlamydia: NEGATIVE
Comment: NEGATIVE
Comment: NEGATIVE
Comment: NEGATIVE
Comment: NEGATIVE
Comment: NEGATIVE
Comment: NORMAL
Neisseria Gonorrhea: NEGATIVE
Trichomonas: NEGATIVE

## 2021-07-12 LAB — IRON,TIBC AND FERRITIN PANEL
%SAT: 13 % (calc) — ABNORMAL LOW (ref 16–45)
Ferritin: 52 ng/mL (ref 16–232)
Iron: 43 ug/dL (ref 40–190)
TIBC: 334 mcg/dL (calc) (ref 250–450)

## 2021-07-12 LAB — HIV ANTIBODY (ROUTINE TESTING W REFLEX): HIV 1&2 Ab, 4th Generation: NONREACTIVE

## 2021-07-12 LAB — CYTOLOGY - PAP
Comment: NEGATIVE
Diagnosis: NEGATIVE
High risk HPV: NEGATIVE

## 2021-07-12 LAB — HEPATITIS C ANTIBODY
Hepatitis C Ab: NONREACTIVE
SIGNAL TO CUT-OFF: 0.02 (ref <1.00)

## 2021-07-12 LAB — RPR: RPR Ser Ql: NONREACTIVE

## 2021-12-12 ENCOUNTER — Other Ambulatory Visit: Payer: Self-pay | Admitting: Podiatry

## 2022-01-04 LAB — HM MAMMOGRAPHY

## 2022-01-07 ENCOUNTER — Other Ambulatory Visit: Payer: Self-pay

## 2022-01-08 ENCOUNTER — Encounter: Payer: Self-pay | Admitting: Nurse Practitioner

## 2022-01-08 ENCOUNTER — Ambulatory Visit: Payer: Managed Care, Other (non HMO) | Admitting: Nurse Practitioner

## 2022-01-08 ENCOUNTER — Other Ambulatory Visit: Payer: Self-pay

## 2022-01-08 VITALS — BP 124/68 | HR 80 | Temp 96.9°F | Ht 66.0 in | Wt 265.2 lb

## 2022-01-08 DIAGNOSIS — E7800 Pure hypercholesterolemia, unspecified: Secondary | ICD-10-CM | POA: Diagnosis not present

## 2022-01-08 DIAGNOSIS — R739 Hyperglycemia, unspecified: Secondary | ICD-10-CM

## 2022-01-08 DIAGNOSIS — Z6841 Body Mass Index (BMI) 40.0 and over, adult: Secondary | ICD-10-CM

## 2022-01-08 DIAGNOSIS — F1011 Alcohol abuse, in remission: Secondary | ICD-10-CM

## 2022-01-08 DIAGNOSIS — T502X5A Adverse effect of carbonic-anhydrase inhibitors, benzothiadiazides and other diuretics, initial encounter: Secondary | ICD-10-CM

## 2022-01-08 DIAGNOSIS — Z809 Family history of malignant neoplasm, unspecified: Secondary | ICD-10-CM

## 2022-01-08 DIAGNOSIS — D500 Iron deficiency anemia secondary to blood loss (chronic): Secondary | ICD-10-CM

## 2022-01-08 DIAGNOSIS — I10 Essential (primary) hypertension: Secondary | ICD-10-CM

## 2022-01-08 DIAGNOSIS — E876 Hypokalemia: Secondary | ICD-10-CM

## 2022-01-08 LAB — BASIC METABOLIC PANEL
BUN: 10 mg/dL (ref 6–23)
CO2: 29 mEq/L (ref 19–32)
Calcium: 9.2 mg/dL (ref 8.4–10.5)
Chloride: 99 mEq/L (ref 96–112)
Creatinine, Ser: 0.68 mg/dL (ref 0.40–1.20)
GFR: 105.36 mL/min (ref >60.00)
Glucose, Bld: 110 mg/dL — ABNORMAL HIGH (ref 70–99)
Potassium: 3.4 mEq/L — ABNORMAL LOW (ref 3.5–5.1)
Sodium: 138 mEq/L (ref 135–145)

## 2022-01-08 LAB — CBC
HCT: 34.5 % — ABNORMAL LOW (ref 36.0–46.0)
Hemoglobin: 11.6 g/dL — ABNORMAL LOW (ref 12.0–15.0)
MCHC: 33.7 g/dL (ref 30.0–36.0)
MCV: 88.6 fl (ref 78.0–100.0)
Platelets: 375 10*3/uL (ref 150.0–400.0)
RBC: 3.89 Mil/uL (ref 3.87–5.11)
RDW: 14 % (ref 11.5–15.5)
WBC: 7 10*3/uL (ref 4.0–10.5)

## 2022-01-08 LAB — IBC + FERRITIN
Ferritin: 57.7 ng/mL (ref 10.0–291.0)
Iron: 50 ug/dL (ref 42–145)
Saturation Ratios: 14.3 % — ABNORMAL LOW (ref 20.0–50.0)
TIBC: 350 ug/dL (ref 250.0–450.0)
Transferrin: 250 mg/dL (ref 212.0–360.0)

## 2022-01-08 LAB — HEMOGLOBIN A1C: Hgb A1c MFr Bld: 6.3 % (ref 4.6–6.5)

## 2022-01-08 LAB — LIPID PANEL
Cholesterol: 182 mg/dL (ref 0–200)
HDL: 56.4 mg/dL (ref >39.00)
LDL Cholesterol: 112 mg/dL — ABNORMAL HIGH (ref 0–99)
NonHDL: 125.57
Total CHOL/HDL Ratio: 3
Triglycerides: 67 mg/dL (ref 0.0–149.0)
VLDL: 13.4 mg/dL (ref 0.0–40.0)

## 2022-01-08 NOTE — Patient Instructions (Signed)
Go to lab for blood draw ? ?How to Increase Your Level of Physical Activity ?Getting regular physical activity is important for your overall health and well-being. Most people do not get enough exercise. There are easy ways to increase your level of physical activity, even if you have not been very active in the past or if you are just starting out. ?What are the benefits of physical activity? ?Physical activity has many short-term and long-term benefits. Being active on a regular basis can improve your physical and mental health as well as provide other benefits. ?Physical health benefits ?Helping you lose weight or maintain a healthy weight. ?Strengthening your muscles and bones. ?Reducing your risk of certain long-term (chronic) diseases, including heart disease, cancer, and diabetes. ?Being able to move around more easily and for longer periods of time without getting tired (increased endurance or stamina). ?Improving your ability to fight off illness (enhanced immunity). ?Being able to sleep better. ?Helping you stay healthy as you get older, including: ?Helping you stay mobile, or capable of walking and moving around. ?Preventing accidents, such as falls. ?Increasing life expectancy. ?Mental health benefits ?Boosting your mood and improving your self-esteem. ?Lowering your chance of having mental health problems, such as depression or anxiety. ?Helping you feel good about your body. ?Other benefits ?Finding new sources of fun and enjoyment. ?Meeting new people who share a common interest. ?Before you begin ?If you have a chronic illness or have not been active for a while, check with your health care provider about how to get started. Ask your health care provider what activities are safe for you. ?Start out slowly. Walking or doing some simple chair exercises is a good place to start, especially if you have not been active before or for a long time. ?Set goals that you can work toward. Ask your health care  provider how much exercise is best for you. In general, most adults should: ?Do moderate-intensity exercise for at least 150 minutes each week (30 minutes on most days of the week) or vigorous exercise for at least 75 minutes each week, or a combination of these. ?Moderate-intensity exercise can include walking at a quick pace, biking, yoga, water aerobics, or gardening. ?Vigorous exercise involves activities that take more effort, such as jogging or running, playing sports, swimming laps, or jumping rope. ?Do strength exercises on at least 2 days each week. This can include weight lifting, body weight exercises, and resistance-band exercises. ?How to be more physically active ?Make a plan ? ?Try to find activities that you enjoy. You are more likely to commit to an exercise routine if it does not feel like a chore. ?If you have bone or joint problems, choose low-impact exercises, like walking or swimming. ?Use these tips for being successful with an exercise plan: ?Find a workout partner for accountability. ?Join a group or class, such as an aerobics class, cycling class, or sports team. ?Make family time active. Go for a walk, bike, or swim. ?Include a variety of exercises each week. ?Consider using a fitness tracker, such as a mobile phone app or a device worn like a watch, that will count the number of steps you take each day. Many people strive to reach 10,000 steps a day. ?Find ways to be active in your daily routines ?Besides your formal exercise plans, you can find ways to do physical activity during your daily routines, such as: ?Walking or biking to work or to the store. ?Taking the stairs instead of the elevator. ?Parking  farther away from the door at work or at the store. ?Planning walking meetings. ?Walking around while you are on the phone. ?Where to find more information ?Centers for Disease Control and Prevention: WorkDashboard.es ?President's Council on [employer], Sports & Nutrition:  www.fitness.gov ?ChooseMyPlate: MassVoice.es ?Contact a health care provider if: ?You have headaches, muscle aches, or joint pain that is concerning. ?You feel dizzy or light-headed while exercising. ?You faint. ?You feel your heart skipping, racing, or fluttering. ?You have chest pain while exercising. ?Summary ?Exercise benefits your mind and body at any age, even if you are just starting out. ?If you have a chronic illness or have not been active for a while, check with your health care provider before increasing your physical activity. ?Choose activities that are safe and enjoyable for you. Ask your health care provider what activities are safe for you. ?Start slowly. Tell your health care provider if you have problems as you start to increase your activity level. ?This information is not intended to replace advice given to you by your health care provider. Make sure you discuss any questions you have with your health care provider. ?Document Revised: 02/02/2021 Document Reviewed: 02/02/2021 ?Elsevier Patient Education ? 2022 [place]. ? ? ?Calorie Counting for Weight Loss ?Calories are units of energy. Your body needs a certain number of calories from food to keep going throughout the day. When you eat or drink more calories than your body needs, your body stores the extra calories mostly as fat. When you eat or drink fewer calories than your body needs, your body burns fat to get the energy it needs. ?Calorie counting means keeping track of how many calories you eat and drink each day. Calorie counting can be helpful if you need to lose weight. If you eat fewer calories than your body needs, you should lose weight. Ask your health care provider what a healthy weight is for you. ?For calorie counting to work, you will need to eat the right number of calories each day to lose a healthy amount of weight per week. A dietitian can help you figure out how many calories you need in a day and will suggest ways  to reach your calorie goal. ?A healthy amount of weight to lose each week is usually 1-2 lb (0.5-0.9 kg). This usually means that your daily calorie intake should be reduced by 500-750 calories. ?Eating 1,200-1,500 calories a day can help most women lose weight. ?Eating 1,500-1,800 calories a day can help most men lose weight. ?What do I need to know about calorie counting? ?Work with your health care provider or dietitian to determine how many calories you should get each day. To meet your daily calorie goal, you will need to: ?Find out how many calories are in each food that you would like to eat. Try to do this before you eat. ?Decide how much of the food you plan to eat. ?Keep a food log. Do this by writing down what you ate and how many calories it had. ?To successfully lose weight, it is important to balance calorie counting with a healthy lifestyle that includes regular activity. ?Where do I find calorie information? ?The number of calories in a food can be found on a Nutrition Facts label. If a food does not have a Nutrition Facts label, try to look up the calories online or ask your dietitian for help. ?Remember that calories are listed per serving. If you choose to have more than one serving of a food, you  will have to multiply the calories per serving by the number of servings you plan to eat. For example, the label on a package of bread might say that a serving size is 1 slice and that there are 90 calories in a serving. If you eat 1 slice, you will have eaten 90 calories. If you eat 2 slices, you will have eaten 180 calories. ?How do I keep a food log? ?After each time that you eat, record the following in your food log as soon as possible: ?What you ate. Be sure to include toppings, sauces, and other extras on the food. ?How much you ate. This can be measured in cups, ounces, or number of items. ?How many calories were in each food and drink. ?The total number of calories in the food you ate. ?Keep  your food log near you, such as in a pocket-sized notebook or on an app or website on your mobile phone. Some programs will calculate calories for you and show you how many calories you have left to meet your d

## 2022-01-08 NOTE — Progress Notes (Signed)
? ?             Established Patient Visit ? ?Patient: Kristin Lawrence   DOB: 08-22-1976   46 y.o. Female  MRN: [id number] ?Visit Date: 01/10/2022 ? ?Subjective:  ?  ?Chief Complaint  ?Patient presents with  ? Follow-up  ?  6 month f/u on HTN.  ?No other questions or concerns.   ? ?HPI ?History of alcohol abuse ?ETOH abuse: quit with use 10/2021 via [facility] inpatient re[facility] x 45days.  ?Previously Dranks 1gallon of vodka ?Onset at age 85. ?Stopped AA group due to negative feedback. ?Denies any anxiety or depression at this time ?Had CBT sessions in [place], but States she does not need any CBT referral at this time ?Current support: Son, mother, and friend (support system) ?F/up 3-50month ? ?Hyperglycemia ?Repeat hgbA1c: gbA1c at 6.3: prediabetes and elevated LDL. Maintain heart healthy diet and regular exercise. ? ?Iron deficiency anemia due to chronic blood loss ?Repeat cbc and iron panel:Stable cbc and iron panel. Start multivitamin with iron. ? ?Benign essential hypertension ?BP at goal with lisinopril/hctz ?No adverse side effects ?BP Readings from Last 3 Encounters:  ?01/08/22 124/68  ?07/11/21 114/78  ?02/07/21 101/70  ? ?Repeat BMP: Normal renal function, but persistent low potassium. This is due to HCTZ for HTN. Decreased dose to 12.'5mg'$ . also sent potassium supplement x 3days ?F/up in 634month ?Family history of cancer ?Mother: colon ?MGM: pancreatic ?M.uncle: esophageal and liver ?P. Aunt: breast ? ?Up to date with colonoscopy, mammogram, and PAP smear. ?She declined referral to genetic counselor ? ?Obesity ?Advised about the importance for heart healthy diet and regular exercise (15065m per week of moderate intensity) ? ?Diuretic-induced hypokalemia ?Normal renal function, but persistent low potassium. This is due to HCTZ for HTN. Decreased dose to 12.'5mg'$ . also sent potassium supplement x 3days ? ? ?Wt Readings from Last 3 Encounters:  ?01/08/22 265 lb 3.2 oz (120.3 kg)  ?07/11/21 265 lb 12.8 oz (120.6 kg)  ?02/07/21 270  lb (122.5 kg)  ?   ?Family History  ?Problem Relation Age of Onset  ? Heart disease Mother 30 7     MI?  ? Colon cancer Mother 52 33 Cystic fibrosis Mother   ? Anuerysm Mother   ? Colon polyps Mother   ? Stroke Maternal Uncle   ? Stroke Paternal Uncle   ? Pancreatic cancer Maternal Grandmother   ? Diabetes Maternal Grandmother   ? Esophageal cancer Maternal Uncle   ? Liver cancer Maternal Uncle   ? Stomach cancer Paternal Grandfather   ? Breast cancer Paternal Aunt   ?     20's  ? Rectal cancer Neg Hx   ?  ?Reviewed medical, surgical, and social history today ? ?Medications: ?Outpatient Medications Prior to Visit  ?Medication Sig Note  ? Cyanocobalamin (VITAMIN B 12 PO) Take by mouth.   ? Ferrous Sulfate (IRON PO) Take by mouth.   ? FOLIC ACID PO Take by mouth.   ? HYDROXYZINE HCL PO Take by mouth. 01/08/2022: PRN  ? omeprazole (PRILOSEC OTC) 20 MG tablet Take 20 mg by mouth daily.   ? Potassium 99 MG TABS Take by mouth.   ? [DISCONTINUED] DOXEPIN HCL PO Take by mouth. 01/08/2022: PRN ?  ? [DISCONTINUED] lisinopril-hydrochlorothiazide (ZESTORETIC) 20-25 MG tablet Take 1 tablet by mouth daily.   ? [DISCONTINUED] thiamine 100 MG tablet Take 100 mg by mouth daily. (Patient not taking: Reported on 01/08/2022)   ? ?No facility-administered medications prior to visit.  ? ?  Reviewed past medical and social history.  ? ?ROS per HPI above ? ? ?   ?Objective:  ?BP 124/68 (BP Location: Left Arm, Patient Position: Sitting, Cuff Size: Large)   Pulse 80   Temp (!) 96.9 ?F (36.1 ?C) (Temporal)   Ht '5\' 6"'$  (1.676 m)   Wt 265 lb 3.2 oz (120.3 kg)   LMP 12/18/2021 (Approximate)   SpO2 98%   BMI 42.80 kg/m?  ? ?  ? ?Physical Exam ?Vitals reviewed.  ?Cardiovascular:  ?   Rate and Rhythm: Normal rate and regular rhythm.  ?   Pulses: Normal pulses.  ?   Heart sounds: Normal heart sounds.  ?Pulmonary:  ?   Effort: Pulmonary effort is normal.  ?   Breath sounds: Normal breath sounds.  ?Neurological:  ?   Mental Status: She is oriented  to person, place, and time.  ?Psychiatric:     ?   Mood and Affect: Mood normal.     ?   Behavior: Behavior normal.     ?   Thought Content: Thought content normal.  ?  ?Results for orders placed or performed in visit on 01/08/22  ?Hemoglobin A1c  ?Result Value Ref Range  ? Hgb A1c MFr Bld 6.3 4.6 - 6.5 %  ?Basic metabolic panel  ?Result Value Ref Range  ? Sodium 138 135 - 145 mEq/L  ? Potassium 3.4 (L) 3.5 - 5.1 mEq/L  ? Chloride 99 96 - 112 mEq/L  ? CO2 29 19 - 32 mEq/L  ? Glucose, Bld 110 (H) 70 - 99 mg/dL  ? BUN 10 6 - 23 mg/dL  ? Creatinine, Ser 0.68 0.40 - 1.20 mg/dL  ? GFR 105.36 >60.00 mL/min  ? Calcium 9.2 8.4 - 10.5 mg/dL  ?Lipid panel  ?Result Value Ref Range  ? Cholesterol 182 0 - 200 mg/dL  ? Triglycerides 67.0 0.0 - 149.0 mg/dL  ? HDL 56.40 >39.00 mg/dL  ? VLDL 13.4 0.0 - 40.0 mg/dL  ? LDL Cholesterol 112 (H) 0 - 99 mg/dL  ? Total CHOL/HDL Ratio 3   ? NonHDL 125.57   ?CBC  ?Result Value Ref Range  ? WBC 7.0 4.0 - 10.5 K/uL  ? RBC 3.89 3.87 - 5.11 Mil/uL  ? Platelets 375.0 150.0 - 400.0 K/uL  ? Hemoglobin 11.6 (L) 12.0 - 15.0 g/dL  ? HCT 34.5 (L) 36.0 - 46.0 %  ? MCV 88.6 78.0 - 100.0 fl  ? MCHC 33.7 30.0 - 36.0 g/dL  ? RDW 14.0 11.5 - 15.5 %  ?IBC + Ferritin  ?Result Value Ref Range  ? Iron 50 42 - 145 ug/dL  ? Transferrin 250.0 212.0 - 360.0 mg/dL  ? Saturation Ratios 14.3 (L) 20.0 - 50.0 %  ? Ferritin 57.7 10.0 - 291.0 ng/mL  ? TIBC 350.0 250.0 - 450.0 mcg/dL  ? ?   ?Assessment & Plan:  ?  ?Problem List Items Addressed This Visit   ? ?  ? Cardiovascular and Mediastinum  ? Benign essential hypertension - Primary  ?  BP at goal with lisinopril/hctz ?No adverse side effects ?BP Readings from Last 3 Encounters:  ?01/08/22 124/68  ?07/11/21 114/78  ?02/07/21 101/70  ? ?Repeat BMP: Normal renal function, but persistent low potassium. This is due to HCTZ for HTN. Decreased dose to 12.'5mg'$ . also sent potassium supplement x 3days ?F/up in 26month?  ?  ? Relevant Medications  ? lisinopril-hydrochlorothiazide  (ZESTORETIC) 20-12.5 MG tablet  ? Other Relevant Orders  ? Basic metabolic  panel (Completed)  ?  ? Other  ? Diuretic-induced hypokalemia  ?  Normal renal function, but persistent low potassium. This is due to HCTZ for HTN. Decreased dose to 12.'5mg'$ . also sent potassium supplement x 3days ? ?  ?  ? Relevant Medications  ? potassium chloride SA (KLOR-CON M) 20 MEQ tablet  ? Family history of cancer  ?  Mother: colon ?MGM: pancreatic ?M.uncle: esophageal and liver ?P. Aunt: breast ? ?Up to date with colonoscopy, mammogram, and PAP smear. ?She declined referral to genetic counselor ?  ?  ? History of alcohol abuse  ?  ETOH abuse: quit with use 10/2021 via [facility] inpatient re[facility] x 45days.  ?Previously Dranks 1gallon of vodka ?Onset at age 7. ?Stopped AA group due to negative feedback. ?Denies any anxiety or depression at this time ?Had CBT sessions in [place], but States she does not need any CBT referral at this time ?Current support: Son, mother, and friend (support system) ?F/up 3-22month ?  ?  ? Hyperglycemia  ?  Repeat hgbA1c: gbA1c at 6.3: prediabetes and elevated LDL. Maintain heart healthy diet and regular exercise. ?  ?  ? Relevant Orders  ? Hemoglobin A1c (Completed)  ? Iron deficiency anemia due to chronic blood loss  ?  Repeat cbc and iron panel:Stable cbc and iron panel. Start multivitamin with iron. ?  ?  ? Relevant Medications  ? Cyanocobalamin (VITAMIN B 12 PO)  ? Other Relevant Orders  ? CBC (Completed)  ? IBC + Ferritin (Completed)  ? Obesity  ?  Advised about the importance for heart healthy diet and regular exercise (1527ms per week of moderate intensity) ?  ?  ? ?Other Visit Diagnoses   ? ? Elevated LDL cholesterol level      ? Relevant Orders  ? Lipid panel (Completed)  ? ?  ? ?Return in about 6 months (around 07/11/2022) for CPE (fasting). ? ?  ? ?ChWilfred LacyNP ? ? ?

## 2022-01-10 ENCOUNTER — Encounter: Payer: Self-pay | Admitting: Nurse Practitioner

## 2022-01-10 DIAGNOSIS — Z809 Family history of malignant neoplasm, unspecified: Secondary | ICD-10-CM | POA: Insufficient documentation

## 2022-01-10 DIAGNOSIS — F10920 Alcohol use, unspecified with intoxication, uncomplicated: Secondary | ICD-10-CM | POA: Insufficient documentation

## 2022-01-10 MED ORDER — POTASSIUM CHLORIDE CRYS ER 20 MEQ PO TBCR: 20.0000 meq | EXTENDED_RELEASE_TABLET | Freq: Two times a day (BID) | ORAL | 0 refills | Status: DC

## 2022-01-10 MED ORDER — LISINOPRIL-HYDROCHLOROTHIAZIDE 20-12.5 MG PO TABS: 1.0000 | ORAL_TABLET | Freq: Every day | ORAL | 3 refills | Status: DC

## 2022-01-10 NOTE — Assessment & Plan Note (Addendum)
ETOH abuse: quit with use 10/2021 via [facility] re[facility] x 45days.  ?Previously Dranks 1gallon of vodka ?Onset at age 46. ?Stopped AA group due to negative feedback. ?Denies any anxiety or depression at this time ?Had CBT sessions in [place], but States she does not need any CBT referral at this time ?Current support: Son, mother, and friend (support system) ?F/up 3-37month ?

## 2022-01-10 NOTE — Assessment & Plan Note (Addendum)
Repeat hgbA1c: gbA1c at 6.3: prediabetes and elevated LDL. Maintain heart healthy diet and regular exercise. ?

## 2022-01-10 NOTE — Assessment & Plan Note (Addendum)
Repeat cbc and iron panel:Stable cbc and iron panel. Start multivitamin with iron. ?

## 2022-01-10 NOTE — Assessment & Plan Note (Addendum)
BP at goal with lisinopril/hctz ?No adverse side effects ?BP Readings from Last 3 Encounters:  ?01/08/22 124/68  ?07/11/21 114/78  ?02/07/21 101/70  ? ?Repeat BMP: Normal renal function, but persistent low potassium. This is due to HCTZ for HTN. Decreased dose to 12.'5mg'$ . also sent potassium supplement x 3days ?F/up in 7month?

## 2022-01-10 NOTE — Assessment & Plan Note (Signed)
Advised about the importance for heart healthy diet and regular exercise (165mns per week of moderate intensity) ?

## 2022-01-10 NOTE — Assessment & Plan Note (Signed)
Normal renal function, but persistent low potassium. This is due to HCTZ for HTN. Decreased dose to 12.'5mg'$ . also sent potassium supplement x 3days ? ?

## 2022-01-10 NOTE — Assessment & Plan Note (Signed)
Mother: colon ?MGM: pancreatic ?M.uncle: esophageal and liver ?P. Aunt: breast ? ?Up to date with colonoscopy, mammogram, and PAP smear. ?She declined referral to genetic counselor ?

## 2022-07-12 ENCOUNTER — Ambulatory Visit (INDEPENDENT_AMBULATORY_CARE_PROVIDER_SITE_OTHER): Payer: Managed Care, Other (non HMO) | Admitting: Nurse Practitioner

## 2022-07-12 ENCOUNTER — Encounter: Payer: Self-pay | Admitting: Nurse Practitioner

## 2022-07-12 ENCOUNTER — Other Ambulatory Visit (HOSPITAL_COMMUNITY)
Admission: RE | Admit: 2022-07-12 | Discharge: 2022-07-12 | Disposition: A | Discharge status: Home / Self Care | Payer: Managed Care, Other (non HMO) | Source: Ambulatory Visit | Attending: Nurse Practitioner | Admitting: Nurse Practitioner

## 2022-07-12 VITALS — BP 120/78 | HR 66 | Temp 97.1°F | Ht 66.0 in | Wt 263.6 lb

## 2022-07-12 DIAGNOSIS — N921 Excessive and frequent menstruation with irregular cycle: Secondary | ICD-10-CM

## 2022-07-12 DIAGNOSIS — Z0001 Encounter for general adult medical examination with abnormal findings: Secondary | ICD-10-CM

## 2022-07-12 DIAGNOSIS — Z13220 Encounter for screening for lipoid disorders: Secondary | ICD-10-CM | POA: Diagnosis not present

## 2022-07-12 DIAGNOSIS — I10 Essential (primary) hypertension: Secondary | ICD-10-CM

## 2022-07-12 DIAGNOSIS — R739 Hyperglycemia, unspecified: Secondary | ICD-10-CM | POA: Diagnosis not present

## 2022-07-12 DIAGNOSIS — Z113 Encounter for screening for infections with a predominantly sexual mode of transmission: Secondary | ICD-10-CM | POA: Insufficient documentation

## 2022-07-12 DIAGNOSIS — N926 Irregular menstruation, unspecified: Secondary | ICD-10-CM | POA: Insufficient documentation

## 2022-07-12 DIAGNOSIS — Z136 Encounter for screening for cardiovascular disorders: Secondary | ICD-10-CM | POA: Diagnosis not present

## 2022-07-12 DIAGNOSIS — D500 Iron deficiency anemia secondary to blood loss (chronic): Secondary | ICD-10-CM | POA: Diagnosis not present

## 2022-07-12 LAB — LIPID PANEL
Cholesterol: 172 mg/dL (ref 0–200)
HDL: 66.3 mg/dL (ref >39.00)
LDL Cholesterol: 96 mg/dL (ref 0–99)
NonHDL: 105.88
Total CHOL/HDL Ratio: 3
Triglycerides: 47 mg/dL (ref 0.0–149.0)
VLDL: 9.4 mg/dL (ref 0.0–40.0)

## 2022-07-12 LAB — CBC
HCT: 34.6 % — ABNORMAL LOW (ref 36.0–46.0)
Hemoglobin: 11.5 g/dL — ABNORMAL LOW (ref 12.0–15.0)
MCHC: 33.4 g/dL (ref 30.0–36.0)
MCV: 89.4 fl (ref 78.0–100.0)
Platelets: 387 10*3/uL (ref 150.0–400.0)
RBC: 3.86 Mil/uL — ABNORMAL LOW (ref 3.87–5.11)
RDW: 13.6 % (ref 11.5–15.5)
WBC: 6.2 10*3/uL (ref 4.0–10.5)

## 2022-07-12 LAB — COMPREHENSIVE METABOLIC PANEL
ALT: 20 U/L (ref 0–35)
AST: 22 U/L (ref 0–37)
Albumin: 4.2 g/dL (ref 3.5–5.2)
Alkaline Phosphatase: 72 U/L (ref 39–117)
BUN: 12 mg/dL (ref 6–23)
CO2: 28 mEq/L (ref 19–32)
Calcium: 9.6 mg/dL (ref 8.4–10.5)
Chloride: 101 mEq/L (ref 96–112)
Creatinine, Ser: 0.75 mg/dL (ref 0.40–1.20)
GFR: 95.97 mL/min (ref >60.00)
Glucose, Bld: 87 mg/dL (ref 70–99)
Potassium: 3.6 mEq/L (ref 3.5–5.1)
Sodium: 139 mEq/L (ref 135–145)
Total Bilirubin: 0.4 mg/dL (ref 0.2–1.2)
Total Protein: 7.9 g/dL (ref 6.0–8.3)

## 2022-07-12 NOTE — Assessment & Plan Note (Signed)
Repeat hgbA1c 

## 2022-07-12 NOTE — Assessment & Plan Note (Addendum)
Vaginal bleeding every 2weeks and after intercourse. Bleeding last about 3-4days. Denies any bleeding today. LMP 06/14/22 x 2days and 06/25/22 x 3days. no pain, no clot, twice a months anbleeding after intercourse, no pain with intercourse. Last PAP 06/2021: normal with negative HPV.  Complete Std screen and pregnancy test today Ordered pelvic US

## 2022-07-12 NOTE — Assessment & Plan Note (Signed)
BP at goal with lisinopril/hctz BP Readings from Last 3 Encounters:  07/12/22 120/78  01/08/22 124/68  07/11/21 114/78   Maintain med dose Check CMP

## 2022-07-12 NOTE — Assessment & Plan Note (Signed)
Excised 05/2022: benign per pathology report

## 2022-07-12 NOTE — Progress Notes (Signed)
Complete physical exam  Patient: Kristin Lawrence   DOB: 10/20/1976   46 y.o. Female  MRN: [id number] Visit Date: 07/12/2022  Subjective:    Chief Complaint  Patient presents with   Annual Exam    CPE Pt fasting  No concerns    Kristin Lawrence is a 46 y.o. female who presents today for a complete physical exam. She reports consuming a general diet.  Weight training, walking, strength training daily  She generally feels well. She reports sleeping well. She does not have additional problems to discuss today.  Vision:Yes Dental:Yes STD Screen:Yes  Wt Readings from Last 3 Encounters:  07/12/22 263 lb 9.6 oz (119.6 kg)  01/08/22 265 lb 3.2 oz (120.3 kg)  07/11/21 265 lb 12.8 oz (120.6 kg)    Most recent fall risk assessment:    02/28/2020   11:50 AM  [place] in the past year? 0  Number falls in past yr: 0  Injury with Fall? 0    Most recent depression screenings:    07/12/2022    9:33 AM 07/11/2021   12:25 PM  PHQ 2/9 Scores  PHQ - 2 Score 0 0  PHQ- 9 Score 2     HPI  Menorrhagia with irregular cycle Vaginal bleeding every 2weeks and after intercourse. Bleeding last about 3-4days. no pain, no clot, twice a months anbleeding after intercourse, no pain with intercourse. Last PAP 06/2021: normal with negative HPV.  Complete Std screen and pregnancy test today Ordered pelvic US   Hyperglycemia Repeat hgbA1c  Iron deficiency anemia due to chronic blood loss Repeat cbc Continue oral iron supplement  Mass of hard palate Excised 05/2022: benign per pathology report  Benign essential hypertension BP at goal with lisinopril/hctz BP Readings from Last 3 Encounters:  07/12/22 120/78  01/08/22 124/68  07/11/21 114/78   Maintain med dose Check CMP   Past Medical History:  Diagnosis Date   Anemia    Anxiety    Benign hypertension    Galactorrhea    GERD (gastroesophageal reflux disease)    Helicobacter pylori ab+ 12/11/09   Per Dr Rayetta Pigg note   IBS  (irritable bowel syndrome)    Liver hemangioma    Obesity    Sleep apnea    Past Surgical History:  Procedure Laterality Date   DENTAL SURGERY     NASAL SINUS SURGERY     twice 2019 and 2011   TUBAL LIGATION     Social History   Socioeconomic History   Marital status: Married    Spouse name: Not on file   Number of children: Not on file   Years of education: Not on file   Highest education level: Not on file  Occupational History   Occupation: Auto Tech   Tobacco Use   Smoking status: Former    Types: Cigarettes    Quit date: 03/20/1997    Years since quitting: 25.3   Smokeless tobacco: Never  Vaping Use   Vaping Use: Never used  Substance and Sexual Activity   Alcohol use: Not Currently   Drug use: No   Sexual activity: Yes    Birth control/protection: Surgical  Other Topics Concern   Not on file  Social History Narrative   Works at [employer] as a Merchant navy officer.  Lives with husband, Same sex domestic partner, and two sons 7, 26.   Daily caffeine    Social Determinants of Health   Financial Resource Strain: Not on file  Food  Insecurity: Not on file  Transportation Needs: Not on file  Physical Activity: Not on file  Stress: Not on file  Social Connections: Not on file  Intimate Partner Violence: Not on file   Family Status  Relation Name Status   Mother  Alive   Father  Alive   Sister  Alive   Mat Uncle  (Not Specified)   Annamarie Major  (Not Specified)   MGM  (Not Specified)   Mat Uncle  (Not Specified)   Mat Uncle  (Not Specified)   PGF  (Not Specified)   Fraser Din Aunt  (Not Specified)   Neg Hx  (Not Specified)   Family History  Problem Relation Age of Onset   Heart disease Mother 27       MI?   Colon cancer Mother 2   Cystic fibrosis Mother    Anuerysm Mother    Colon polyps Mother    Stroke Maternal Uncle    Stroke Paternal Uncle    Pancreatic cancer Maternal Grandmother    Diabetes Maternal Grandmother    Esophageal cancer Maternal Uncle     Liver cancer Maternal Uncle    Stomach cancer Paternal Grandfather    Breast cancer Paternal Aunt        20's   Rectal cancer Neg Hx    No Known Allergies  Patient Care Team: Nayanna Seaborn, Charlene Brooke, NP as PCP - General (Internal Medicine)   Medications: Outpatient Medications Prior to Visit  Medication Sig   Cyanocobalamin (VITAMIN B 12 PO) Take by mouth.   Ferrous Sulfate (IRON PO) Take by mouth.   FOLIC ACID PO Take by mouth.   HYDROXYZINE HCL PO Take by mouth.   lisinopril-hydrochlorothiazide (ZESTORETIC) 20-12.5 MG tablet Take 1 tablet by mouth daily.   omeprazole (PRILOSEC OTC) 20 MG tablet Take 20 mg by mouth daily.   Potassium 99 MG TABS Take by mouth.   [DISCONTINUED] potassium chloride SA (KLOR-CON M) 20 MEQ tablet Take 1 tablet (20 mEq total) by mouth 2 (two) times daily. (Patient not taking: Reported on 07/12/2022)   No facility-administered medications prior to visit.    Review of Systems  Constitutional:  Negative for fever.  HENT:  Negative for congestion and sore throat.   Eyes:        Negative for visual changes  Respiratory:  Negative for cough and shortness of breath.   Cardiovascular:  Negative for chest pain, palpitations and leg swelling.  Gastrointestinal:  Negative for blood in stool, constipation and diarrhea.  Genitourinary:  Positive for menstrual problem. Negative for decreased urine volume, dysuria, frequency, urgency, vaginal bleeding, vaginal discharge and vaginal pain.  Musculoskeletal:  Negative for myalgias.  Skin:  Negative for rash.  Neurological:  Negative for dizziness and headaches.  Hematological:  Does not bruise/bleed easily.  Psychiatric/Behavioral:  Negative for suicidal ideas. The patient is not nervous/anxious.    Last CBC Lab Results  Component Value Date   WBC 7.0 01/08/2022   HGB 11.6 (L) 01/08/2022   HCT 34.5 (L) 01/08/2022   MCV 88.6 01/08/2022   MCH 24.6 (L) 02/06/2017   RDW 14.0 01/08/2022   PLT 375.0 04/15/9484   Last  metabolic panel Lab Results  Component Value Date   GLUCOSE 110 (H) 01/08/2022   NA 138 01/08/2022   K 3.4 (L) 01/08/2022   CL 99 01/08/2022   CO2 29 01/08/2022   BUN 10 01/08/2022   CREATININE 0.68 01/08/2022   GFRNONAA 103 07/16/2018   CALCIUM 9.2 01/08/2022  PROT 7.5 07/11/2021   ALBUMIN 4.2 07/11/2021   BILITOT 0.3 07/11/2021   ALKPHOS 75 07/11/2021   AST 20 07/11/2021   ALT 16 07/11/2021   ANIONGAP 16 (H) 08/25/2016   Last thyroid functions Lab Results  Component Value Date   TSH 2.84 07/11/2021        Objective:  BP 120/78 (BP Location: Right Arm, Patient Position: Sitting, Cuff Size: Normal)   Pulse 66   Temp (!) 97.1 F (36.2 C) (Temporal)   Ht '5\' 6"'$  (1.676 m)   Wt 263 lb 9.6 oz (119.6 kg)   LMP 06/23/2022 (Exact Date)   SpO2 98%   BMI 42.55 kg/m     BP Readings from Last 3 Encounters:  07/12/22 120/78  01/08/22 124/68  07/11/21 114/78   Wt Readings from Last 3 Encounters:  07/12/22 263 lb 9.6 oz (119.6 kg)  01/08/22 265 lb 3.2 oz (120.3 kg)  07/11/21 265 lb 12.8 oz (120.6 kg)   Physical Exam Vitals reviewed.  Constitutional:      General: She is not in acute distress. HENT:     Right Ear: External ear normal.     Left Ear: External ear normal.     Nose: Nose normal.     Mouth/Throat:     Pharynx: No oropharyngeal exudate.  Eyes:     General: No scleral icterus. Cardiovascular:     Rate and Rhythm: Normal rate and regular rhythm.     Heart sounds: Normal heart sounds.  Pulmonary:     Effort: Pulmonary effort is normal. No respiratory distress.     Breath sounds: Normal breath sounds.  Abdominal:     General: Bowel sounds are normal. There is no distension.     Palpations: Abdomen is soft.     Tenderness: There is no abdominal tenderness. There is no guarding.  Genitourinary:    Comments: Declined pelvic and breast exam Musculoskeletal:        General: Normal range of motion.     Cervical back: Normal range of motion and neck supple.   Lymphadenopathy:     Cervical: No cervical adenopathy.  Skin:    General: Skin is warm and dry.  Neurological:     Mental Status: She is alert and oriented to person, place, and time.  Psychiatric:        Mood and Affect: Mood normal.        Behavior: Behavior normal.        Thought Content: Thought content normal.      No results found for any visits on 07/12/22.    Assessment & Plan:    Routine Health Maintenance and Physical Exam  Immunization History  Administered Date(s) Administered   Influenza-Unspecified 07/22/2019   PFIZER(Purple Top)SARS-COV-2 Vaccination 02/10/2020   Td 10/24/2002   Tdap 11/29/2019   Health Maintenance  Topic Date Due   INFLUENZA VACCINE  01/19/2023 (Originally 05/21/2022)   PAP SMEAR-Modifier  07/11/2024   COLONOSCOPY (Pts 45-33yr Insurance coverage will need to be confirmed)  01/09/2025   TETANUS/TDAP  11/28/2029   Hepatitis C Screening  Completed   HIV Screening  Completed   HPV VACCINES  Aged Out   COVID-19 Vaccine  Discontinued   Discussed health benefits of physical activity, and encouraged her to engage in regular exercise appropriate for her age and condition.  Problem List Items Addressed This Visit       Cardiovascular and Mediastinum   Benign essential hypertension    BP at goal with  lisinopril/hctz BP Readings from Last 3 Encounters:  07/12/22 120/78  01/08/22 124/68  07/11/21 114/78   Maintain med dose Check CMP        Other   Hyperglycemia    Repeat hgbA1c      Relevant Orders   Hemoglobin A1c   Iron deficiency anemia due to chronic blood loss    Repeat cbc Continue oral iron supplement      Menorrhagia with irregular cycle    Vaginal bleeding every 2weeks and after intercourse. Bleeding last about 3-4days. no pain, no clot, twice a months anbleeding after intercourse, no pain with intercourse. Last PAP 06/2021: normal with negative HPV.  Complete Std screen and pregnancy test today Ordered pelvic  US       Relevant Orders   US Pelvic Complete With Transvaginal   Other Visit Diagnoses     Encounter for preventative adult health care exam with abnormal findings    -  Primary   Relevant Orders   Lipid panel   CBC   Comprehensive metabolic panel   Screen for STD (sexually transmitted disease)       Relevant Orders   RPR   Urine cytology ancillary only   HIV antibody (with reflex)   Encounter for lipid screening for cardiovascular disease       Relevant Orders   Lipid panel      Return in about 6 months (around 01/10/2023) for HTN.     Wilfred Lacy, NP

## 2022-07-12 NOTE — Patient Instructions (Addendum)
Go to lab You will be contacted to schedule appt for pelvic US  Low Back Sprain or Strain Rehab Ask your health care provider which exercises are safe for you. Do exercises exactly as told by your health care provider and adjust them as directed. It is normal to feel mild stretching, pulling, tightness, or discomfort as you do these exercises. Stop right away if you feel sudden pain or your pain gets worse. Do not begin these exercises until told by your health care provider. Stretching and range-of-motion exercises These exercises warm up your muscles and joints and improve the movement and flexibility of your back. These exercises also help to relieve pain, numbness, and tingling. Lumbar rotation  Lie on your back on a firm bed or the floor with your knees bent. Straighten your arms out to your sides so each arm forms a 90-degree angle (right angle) with a side of your body. Slowly move (rotate) both of your knees to one side of your body until you feel a stretch in your lower back (lumbar). Try not to let your shoulders lift off the floor. Hold this position for __________ seconds. Tense your abdominal muscles and slowly move your knees back to the starting position. Repeat this exercise on the other side of your body. Repeat __________ times. Complete this exercise __________ times a day. Single knee to chest  Lie on your back on a firm bed or the floor with both legs straight. Bend one of your knees. Use your hands to move your knee up toward your chest until you feel a gentle stretch in your lower back and buttock. Hold your leg in this position by holding on to the front of your knee. Keep your other leg as straight as possible. Hold this position for __________ seconds. Slowly return to the starting position. Repeat with your other leg. Repeat __________ times. Complete this exercise __________ times a day. Prone extension on elbows  Lie on your abdomen on a firm bed or the floor  (prone position). Prop yourself up on your elbows. Use your arms to help lift your chest up until you feel a gentle stretch in your abdomen and your lower back. This will place some of your body weight on your elbows. If this is uncomfortable, try stacking pillows under your chest. Your hips should stay down, against the surface that you are lying on. Keep your hip and back muscles relaxed. Hold this position for __________ seconds. Slowly relax your upper body and return to the starting position. Repeat __________ times. Complete this exercise __________ times a day. Strengthening exercises These exercises build strength and endurance in your back. Endurance is the ability to use your muscles for a long time, even after they get tired. Pelvic tilt This exercise strengthens the muscles that lie deep in the abdomen. Lie on your back on a firm bed or the floor with your legs extended. Bend your knees so they are pointing toward the ceiling and your feet are flat on the floor. Tighten your lower abdominal muscles to press your lower back against the floor. This motion will tilt your pelvis so your tailbone points up toward the ceiling instead of pointing to your feet or the floor. To help with this exercise, you may place a small towel under your lower back and try to push your back into the towel. Hold this position for __________ seconds. Let your muscles relax completely before you repeat this exercise. Repeat __________ times. Complete this exercise  __________ times a day. Alternating arm and leg raises  Get on your hands and knees on a firm surface. If you are on a hard floor, you may want to use padding, such as an exercise mat, to cushion your knees. Line up your arms and legs. Your hands should be directly below your shoulders, and your knees should be directly below your hips. Lift your left leg behind you. At the same time, raise your right arm and straighten it in front of you. Do not  lift your leg higher than your hip. Do not lift your arm higher than your shoulder. Keep your abdominal and back muscles tight. Keep your hips facing the ground. Do not arch your back. Keep your balance carefully, and do not hold your breath. Hold this position for __________ seconds. Slowly return to the starting position. Repeat with your right leg and your left arm. Repeat __________ times. Complete this exercise __________ times a day. Abdominal set with straight leg raise  Lie on your back on a firm bed or the floor. Bend one of your knees and keep your other leg straight. Tense your abdominal muscles and lift your straight leg up, 4-6 inches (10-15 cm) off the ground. Keep your abdominal muscles tight and hold this position for __________ seconds. Do not hold your breath. Do not arch your back. Keep it flat against the ground. Keep your abdominal muscles tense as you slowly lower your leg back to the starting position. Repeat with your other leg. Repeat __________ times. Complete this exercise __________ times a day. Single leg lower with bent knees Lie on your back on a firm bed or the floor. Tense your abdominal muscles and lift your feet off the floor, one foot at a time, so your knees and hips are bent in 90-degree angles (right angles). Your knees should be over your hips and your lower legs should be parallel to the floor. Keeping your abdominal muscles tense and your knee bent, slowly lower one of your legs so your toe touches the ground. Lift your leg back up to return to the starting position. Do not hold your breath. Do not let your back arch. Keep your back flat against the ground. Repeat with your other leg. Repeat __________ times. Complete this exercise __________ times a day. Posture and body mechanics Good posture and healthy body mechanics can help to relieve stress in your body's tissues and joints. Body mechanics refers to the movements and positions of your  body while you do your daily activities. Posture is part of body mechanics. Good posture means: Your spine is in its natural S-curve position (neutral). Your shoulders are pulled back slightly. Your head is not tipped forward (neutral). Follow these guidelines to improve your posture and body mechanics in your everyday activities. Standing  When standing, keep your spine neutral and your feet about hip-width apart. Keep a slight bend in your knees. Your ears, shoulders, and hips should line up. When you do a task in which you stand in one place for a long time, place one foot up on a stable object that is 2-4 inches (5-10 cm) high, such as a footstool. This helps keep your spine neutral. Sitting  When sitting, keep your spine neutral and keep your feet flat on the floor. Use a footrest, if necessary, and keep your thighs parallel to the floor. Avoid rounding your shoulders, and avoid tilting your head forward. When working at a desk or a Teaching laboratory technician, keep your desk  at a height where your hands are slightly lower than your elbows. Slide your chair under your desk so you are close enough to maintain good posture. When working at a computer, place your monitor at a height where you are looking straight ahead and you do not have to tilt your head forward or downward to look at the screen. Resting When lying down and resting, avoid positions that are most painful for you. If you have pain with activities such as sitting, bending, stooping, or squatting, lie in a position in which your body does not bend very much. For example, avoid curling up on your side with your arms and knees near your chest (fetal position). If you have pain with activities such as standing for a long time or reaching with your arms, lie with your spine in a neutral position and bend your knees slightly. Try the following positions: Lying on your side with a pillow between your knees. Lying on your back with a pillow under your  knees. Lifting  When lifting objects, keep your feet at least shoulder-width apart and tighten your abdominal muscles. Bend your knees and hips and keep your spine neutral. It is important to lift using the strength of your legs, not your back. Do not lock your knees straight out. Always ask for help to lift heavy or awkward objects. This information is not intended to replace advice given to you by your health care provider. Make sure you discuss any questions you have with your health care provider. Document Revised: 12/25/2020 Document Reviewed: 12/25/2020 Elsevier Patient Education  [place].

## 2022-07-12 NOTE — Assessment & Plan Note (Signed)
Repeat cbc Continue oral iron supplement

## 2022-07-13 LAB — HEMOGLOBIN A1C
Hgb A1c MFr Bld: 5.8 % of total Hgb — ABNORMAL HIGH (ref <5.7)
Mean Plasma Glucose: 120 mg/dL
eAG (mmol/L): 6.6 mmol/L

## 2022-07-15 LAB — URINE CYTOLOGY ANCILLARY ONLY
Chlamydia: NEGATIVE
Comment: NEGATIVE
Comment: NEGATIVE
Comment: NORMAL
Neisseria Gonorrhea: NEGATIVE
Trichomonas: NEGATIVE

## 2022-07-15 LAB — HIV ANTIBODY (ROUTINE TESTING W REFLEX): HIV 1&2 Ab, 4th Generation: NONREACTIVE

## 2022-07-15 LAB — RPR: RPR Ser Ql: NONREACTIVE

## 2022-07-16 ENCOUNTER — Ambulatory Visit
Admission: RE | Admit: 2022-07-16 | Discharge: 2022-07-16 | Disposition: A | Discharge status: Home / Self Care | Payer: Managed Care, Other (non HMO) | Source: Ambulatory Visit | Attending: Nurse Practitioner | Admitting: Nurse Practitioner

## 2022-07-16 DIAGNOSIS — N921 Excessive and frequent menstruation with irregular cycle: Secondary | ICD-10-CM

## 2022-07-17 NOTE — Addendum Note (Signed)
Addended by: Leana Gamer on: 07/17/2022 08:23 AM   Modules accepted: Orders

## 2022-08-09 ENCOUNTER — Telehealth: Payer: Self-pay | Admitting: Nurse Practitioner

## 2022-08-09 NOTE — Telephone Encounter (Signed)
Pt stated that you referred her to Obgyn but there next opening appt is not until January. If no answer lvm

## 2022-08-14 NOTE — Telephone Encounter (Signed)
[facility] However I dont know if they will be able to get her an appt sooner than the 1st location. They will call her. An updated letter to the patient has been sent to her via mychart with referred to location address and phone #

## 2022-12-21 ENCOUNTER — Other Ambulatory Visit: Payer: Self-pay | Admitting: Nurse Practitioner

## 2022-12-21 DIAGNOSIS — I10 Essential (primary) hypertension: Secondary | ICD-10-CM

## 2023-01-10 ENCOUNTER — Ambulatory Visit: Payer: Managed Care, Other (non HMO) | Admitting: Nurse Practitioner

## 2023-01-13 ENCOUNTER — Encounter: Payer: Self-pay | Admitting: Nurse Practitioner

## 2023-01-13 ENCOUNTER — Ambulatory Visit: Payer: Managed Care, Other (non HMO) | Admitting: Nurse Practitioner

## 2023-01-13 VITALS — BP 120/76 | HR 76 | Temp 98.3°F | Resp 16 | Ht 66.0 in | Wt 253.0 lb

## 2023-01-13 DIAGNOSIS — Z6841 Body Mass Index (BMI) 40.0 and over, adult: Secondary | ICD-10-CM

## 2023-01-13 DIAGNOSIS — D500 Iron deficiency anemia secondary to blood loss (chronic): Secondary | ICD-10-CM | POA: Diagnosis not present

## 2023-01-13 DIAGNOSIS — R739 Hyperglycemia, unspecified: Secondary | ICD-10-CM

## 2023-01-13 DIAGNOSIS — I10 Essential (primary) hypertension: Secondary | ICD-10-CM

## 2023-01-13 LAB — CBC
HCT: 35.3 % — ABNORMAL LOW (ref 36.0–46.0)
Hemoglobin: 11.9 g/dL — ABNORMAL LOW (ref 12.0–15.0)
MCHC: 33.6 g/dL (ref 30.0–36.0)
MCV: 88.6 fl (ref 78.0–100.0)
Platelets: 358 10*3/uL (ref 150.0–400.0)
RBC: 3.98 Mil/uL (ref 3.87–5.11)
RDW: 14.1 % (ref 11.5–15.5)
WBC: 5.6 10*3/uL (ref 4.0–10.5)

## 2023-01-13 LAB — HEMOGLOBIN A1C: Hgb A1c MFr Bld: 6.3 % (ref 4.6–6.5)

## 2023-01-13 NOTE — Patient Instructions (Addendum)
Go to lab Monitor BP daily in AM Call office if BP<90/60 or >140/80 Keep up the good work

## 2023-01-13 NOTE — Assessment & Plan Note (Signed)
Lost 10lbs in last 83months thorught heart healthy diet and daily exercise Wt Readings from Last 3 Encounters:  01/13/23 253 lb (114.8 kg)  07/12/22 263 lb 9.6 oz (119.6 kg)  01/08/22 265 lb 3.2 oz (120.3 kg)    Encouraged to maintain lifestyle modifications

## 2023-01-13 NOTE — Assessment & Plan Note (Signed)
Repeat iron and cbc Maintain MVI with iron supplement

## 2023-01-13 NOTE — Progress Notes (Signed)
Established Patient Visit  Patient: Kristin Lawrence   DOB: June 23, 1976   47 y.o. Female  MRN: [id number] Visit Date: 01/13/2023  Subjective:    Chief Complaint  Patient presents with   Hypertension   HPI Benign essential hypertension She discontinued lisinopril/hctz 3days ago. States she no longer wants to take med. BP Readings from Last 3 Encounters:  01/13/23 120/76  07/12/22 120/78  01/08/22 124/68    Advised about risk of uncontrolled HTN, need to monitor BP daily in AM (provided BP parameters), need to maintain DASH diet and daily exercise. F/up in 46month or sooner if needed  Hyperglycemia Repeat hgbA1c  Class 3 severe obesity due to excess calories with serious comorbidity and body mass index (BMI) of 40.0 to 44.9 in adult [facility]) Lost 10lbs in last 30months thorught heart healthy diet and daily exercise Wt Readings from Last 3 Encounters:  01/13/23 253 lb (114.8 kg)  07/12/22 263 lb 9.6 oz (119.6 kg)  01/08/22 265 lb 3.2 oz (120.3 kg)    Encouraged to maintain lifestyle modifications  Iron deficiency anemia due to chronic blood loss Repeat iron and cbc Maintain MVI with iron supplement  BP Readings from Last 3 Encounters:  01/13/23 120/76  07/12/22 120/78  01/08/22 124/68    Wt Readings from Last 3 Encounters:  01/13/23 253 lb (114.8 kg)  07/12/22 263 lb 9.6 oz (119.6 kg)  01/08/22 265 lb 3.2 oz (120.3 kg)    Reviewed medical, surgical, and social history today  Medications: Outpatient Medications Prior to Visit  Medication Sig   Cyanocobalamin (VITAMIN B 12 PO) Take by mouth.   Ferrous Sulfate (IRON PO) Take by mouth.   FOLIC ACID PO Take by mouth.   HYDROXYZINE HCL PO Take by mouth.   omeprazole (PRILOSEC OTC) 20 MG tablet Take 20 mg by mouth daily.   [DISCONTINUED] lisinopril-hydrochlorothiazide (ZESTORETIC) 20-12.5 MG tablet Take 1 tablet by mouth once daily   [DISCONTINUED] Potassium 99 MG TABS Take by mouth.   No facility-administered  medications prior to visit.   Reviewed past medical and social history.   ROS per HPI above      Objective:  BP 120/76 (BP Location: Left Arm, Patient Position: Sitting, Cuff Size: Large)   Pulse 76   Temp 98.3 F (36.8 C) (Temporal)   Resp 16   Ht 5\' 6"  (1.676 m)   Wt 253 lb (114.8 kg)   SpO2 98%   BMI 40.84 kg/m      Physical Exam Vitals and nursing note reviewed.  Constitutional:      Appearance: She is obese.  Cardiovascular:     Rate and Rhythm: Normal rate and regular rhythm.     Pulses: Normal pulses.     Heart sounds: Normal heart sounds.  Pulmonary:     Effort: Pulmonary effort is normal.     Breath sounds: Normal breath sounds.  Neurological:     Mental Status: She is alert and oriented to person, place, and time.     No results found for any visits on 01/13/23.    Assessment & Plan:    Problem List Items Addressed This Visit       Cardiovascular and Mediastinum   Benign essential hypertension - Primary    She discontinued lisinopril/hctz 3days ago. States she no longer wants to take med. BP Readings from Last 3 Encounters:  01/13/23 120/76  07/12/22 120/78  01/08/22 124/68  Advised about risk of uncontrolled HTN, need to monitor BP daily in AM (provided BP parameters), need to maintain DASH diet and daily exercise. F/up in 64month or sooner if needed        Other   Class 3 severe obesity due to excess calories with serious comorbidity and body mass index (BMI) of 40.0 to 44.9 in adult [facility])    Lost 10lbs in last 24months thorught heart healthy diet and daily exercise Wt Readings from Last 3 Encounters:  01/13/23 253 lb (114.8 kg)  07/12/22 263 lb 9.6 oz (119.6 kg)  01/08/22 265 lb 3.2 oz (120.3 kg)    Encouraged to maintain lifestyle modifications      Hyperglycemia    Repeat hgbA1c      Relevant Orders   Hemoglobin A1c   Iron deficiency anemia due to chronic blood loss    Repeat iron and cbc Maintain MVI with iron supplement       Relevant Orders   IBC + Ferritin   CBC   Return in about 6 months (around 07/16/2023) for CPE (fasting).     Wilfred Lacy, NP

## 2023-01-13 NOTE — Assessment & Plan Note (Signed)
Repeat hgbA1c 

## 2023-01-13 NOTE — Assessment & Plan Note (Signed)
She discontinued lisinopril/hctz 3days ago. States she no longer wants to take med. BP Readings from Last 3 Encounters:  01/13/23 120/76  07/12/22 120/78  01/08/22 124/68    Advised about risk of uncontrolled HTN, need to monitor BP daily in AM (provided BP parameters), need to maintain DASH diet and daily exercise. F/up in 32month or sooner if needed

## 2023-01-14 LAB — IBC + FERRITIN
Ferritin: 77.2 ng/mL (ref 10.0–291.0)
Iron: 84 ug/dL (ref 42–145)
Saturation Ratios: 26.7 % (ref 20.0–50.0)
TIBC: 315 ug/dL (ref 250.0–450.0)
Transferrin: 225 mg/dL (ref 212.0–360.0)

## 2023-01-17 NOTE — Progress Notes (Signed)
Abnormal: Stable cbc with normal iron panel Increased in hgbA1c: 5.8 to 6.3% = prediabetes which means you are at risk of becoming diabetic. Maintain Low carb/low sugar/ high protein/high fiber diet and daily exercise. Schedule F/up in 39months instead of 83months

## 2023-03-21 ENCOUNTER — Encounter: Payer: Self-pay | Admitting: Nurse Practitioner

## 2023-03-21 ENCOUNTER — Telehealth: Payer: Self-pay

## 2023-03-21 ENCOUNTER — Other Ambulatory Visit: Payer: Self-pay | Admitting: Nurse Practitioner

## 2023-03-21 DIAGNOSIS — I10 Essential (primary) hypertension: Secondary | ICD-10-CM

## 2023-03-21 NOTE — Telephone Encounter (Signed)
The front desk transferred Kristin Lawrence to me. Kristin Lawrence requested a refill of her lisinopril- hctz. When she contacted the pharmacy it was denied because it was denied by pcp.  At her last visit 01/12/23 she wanted to stop taking the bp mediation to see if her blood pressure would go up.  After 4 days he bp went up to around 160/90 and she decided to take the medication.  She stopped taking her bp in march once she started taking the lisinopril again.  [name] would like her to monitor her blood pressure over the weekend and submit the reading through [facility] Monday.  Once she sees the readings she will refill medication.

## 2023-03-21 NOTE — Telephone Encounter (Signed)
Pt is no longer taking this medication.

## 2023-03-25 MED ORDER — LISINOPRIL-HYDROCHLOROTHIAZIDE 20-12.5 MG PO TABS
1.0000 | ORAL_TABLET | Freq: Every day | ORAL | 1 refills | Status: DC
Start: 2023-03-25 — End: 2023-09-30

## 2023-04-15 ENCOUNTER — Ambulatory Visit: Payer: Managed Care, Other (non HMO) | Admitting: Nurse Practitioner

## 2023-04-15 VITALS — BP 120/72 | HR 56 | Temp 98.0°F | Resp 16 | Ht 66.0 in | Wt 243.4 lb

## 2023-04-15 DIAGNOSIS — I10 Essential (primary) hypertension: Secondary | ICD-10-CM

## 2023-04-15 DIAGNOSIS — R739 Hyperglycemia, unspecified: Secondary | ICD-10-CM | POA: Diagnosis not present

## 2023-04-15 LAB — BASIC METABOLIC PANEL
BUN: 15 mg/dL (ref 6–23)
CO2: 30 mEq/L (ref 19–32)
Calcium: 9.7 mg/dL (ref 8.4–10.5)
Chloride: 102 mEq/L (ref 96–112)
Creatinine, Ser: 0.8 mg/dL (ref 0.40–1.20)
GFR: 88.35 mL/min (ref >60.00)
Glucose, Bld: 100 mg/dL — ABNORMAL HIGH (ref 70–99)
Potassium: 4 mEq/L (ref 3.5–5.1)
Sodium: 140 mEq/L (ref 135–145)

## 2023-04-15 LAB — HEMOGLOBIN A1C: Hgb A1c MFr Bld: 5.8 % (ref 4.6–6.5)

## 2023-04-15 NOTE — Patient Instructions (Signed)
Go to lab Continue Heart healthy diet and daily exercise. Maintain current medications. 

## 2023-04-15 NOTE — Progress Notes (Signed)
Stable Follow instructions as discussed during office visit.

## 2023-04-15 NOTE — Progress Notes (Signed)
                Established Patient Visit  Patient: Kristin Lawrence   DOB: Aug 29, 1976   47 y.o. Female  MRN: [id number] Visit Date: 04/15/2023  Subjective:    Chief Complaint  Patient presents with   Hypertension   Hyperglycemia    3 mos f/u    HPI Benign essential hypertension Resume lisinopril/hydrochlorothiazide after she noted elevated BO at home Bp at goal today. BP Readings from Last 3 Encounters:  04/15/23 120/72  01/13/23 120/76  07/12/22 120/78    Maintain med dose  repeat BMP F/up in 6months  Hyperglycemia Repeat hgbA1c  Wt Readings from Last 3 Encounters:  04/15/23 243 lb 6.4 oz (110.4 kg)  01/13/23 253 lb (114.8 kg)  07/12/22 263 lb 9.6 oz (119.6 kg)    Reviewed medical, surgical, and social history today  Medications: Outpatient Medications Prior to Visit  Medication Sig   Cyanocobalamin (VITAMIN B 12 PO) Take by mouth.   Ferrous Sulfate (IRON PO) Take by mouth.   FOLIC ACID PO Take by mouth.   HYDROXYZINE HCL PO Take by mouth.   lisinopril-hydrochlorothiazide (ZESTORETIC) 20-12.5 MG tablet Take 1 tablet by mouth daily.   omeprazole (PRILOSEC OTC) 20 MG tablet Take 20 mg by mouth daily.   No facility-administered medications prior to visit.   Reviewed past medical and social history.   ROS per HPI above      Objective:  BP 120/72 (BP Location: Left Arm, Patient Position: Sitting, Cuff Size: Large)   Pulse (!) 56   Temp 98 F (36.7 C) (Temporal)   Resp 16   Ht 5\' 6"  (1.676 m)   Wt 243 lb 6.4 oz (110.4 kg)   SpO2 100%   BMI 39.29 kg/m      Physical Exam Cardiovascular:     Rate and Rhythm: Normal rate and regular rhythm.     Pulses: Normal pulses.     Heart sounds: Normal heart sounds.  Pulmonary:     Effort: Pulmonary effort is normal.     Breath sounds: Normal breath sounds.  Neurological:     Mental Status: She is alert and oriented to person, place, and time.     No results found for any visits on 04/15/23.    Assessment &  Plan:    Problem List Items Addressed This Visit       Cardiovascular and Mediastinum   Benign essential hypertension    Resume lisinopril/hydrochlorothiazide after she noted elevated BO at home Bp at goal today. BP Readings from Last 3 Encounters:  04/15/23 120/72  01/13/23 120/76  07/12/22 120/78    Maintain med dose  repeat BMP F/up in 6months      Relevant Orders   Basic metabolic panel     Other   Hyperglycemia - Primary    Repeat hgbA1c      Relevant Orders   POCT glycosylated hemoglobin (Hb A1C)   Hemoglobin A1c   Return in about 6 months (around 10/15/2023) for CPE (fasting).     Alysia Penna, NP

## 2023-04-15 NOTE — Assessment & Plan Note (Signed)
Resume lisinopril/hydrochlorothiazide after she noted elevated BO at home Bp at goal today. BP Readings from Last 3 Encounters:  04/15/23 120/72  01/13/23 120/76  07/12/22 120/78    Maintain med dose  repeat BMP F/up in 6months

## 2023-04-15 NOTE — Assessment & Plan Note (Signed)
Repeat hgbA1c 

## 2023-09-14 ENCOUNTER — Other Ambulatory Visit: Payer: Self-pay | Admitting: Nurse Practitioner

## 2023-09-14 DIAGNOSIS — I10 Essential (primary) hypertension: Secondary | ICD-10-CM

## 2023-09-30 ENCOUNTER — Encounter: Payer: Self-pay | Admitting: Nurse Practitioner

## 2023-09-30 ENCOUNTER — Ambulatory Visit: Payer: Managed Care, Other (non HMO) | Admitting: Nurse Practitioner

## 2023-09-30 VITALS — BP 123/78 | HR 54 | Temp 98.2°F | Resp 18 | Ht 66.0 in | Wt 245.8 lb

## 2023-09-30 DIAGNOSIS — R7303 Prediabetes: Secondary | ICD-10-CM | POA: Diagnosis not present

## 2023-09-30 DIAGNOSIS — Z13220 Encounter for screening for lipoid disorders: Secondary | ICD-10-CM

## 2023-09-30 DIAGNOSIS — E876 Hypokalemia: Secondary | ICD-10-CM

## 2023-09-30 DIAGNOSIS — N926 Irregular menstruation, unspecified: Secondary | ICD-10-CM

## 2023-09-30 DIAGNOSIS — D500 Iron deficiency anemia secondary to blood loss (chronic): Secondary | ICD-10-CM

## 2023-09-30 DIAGNOSIS — Z1159 Encounter for screening for other viral diseases: Secondary | ICD-10-CM | POA: Diagnosis not present

## 2023-09-30 DIAGNOSIS — F411 Generalized anxiety disorder: Secondary | ICD-10-CM

## 2023-09-30 DIAGNOSIS — I10 Essential (primary) hypertension: Secondary | ICD-10-CM

## 2023-09-30 DIAGNOSIS — Z136 Encounter for screening for cardiovascular disorders: Secondary | ICD-10-CM | POA: Diagnosis not present

## 2023-09-30 DIAGNOSIS — R768 Other specified abnormal immunological findings in serum: Secondary | ICD-10-CM

## 2023-09-30 DIAGNOSIS — Z0001 Encounter for general adult medical examination with abnormal findings: Secondary | ICD-10-CM

## 2023-09-30 DIAGNOSIS — Z0000 Encounter for general adult medical examination without abnormal findings: Secondary | ICD-10-CM

## 2023-09-30 DIAGNOSIS — Z9851 Tubal ligation status: Secondary | ICD-10-CM | POA: Insufficient documentation

## 2023-09-30 LAB — CBC WITH DIFFERENTIAL/PLATELET
Basophils Absolute: 0.1 10*3/uL (ref 0.0–0.1)
Basophils Relative: 1.1 % (ref 0.0–3.0)
Eosinophils Absolute: 0.1 10*3/uL (ref 0.0–0.7)
Eosinophils Relative: 1 % (ref 0.0–5.0)
HCT: 36.8 % (ref 36.0–46.0)
Hemoglobin: 12.3 g/dL (ref 12.0–15.0)
Lymphocytes Relative: 49.4 % — ABNORMAL HIGH (ref 12.0–46.0)
Lymphs Abs: 3.2 10*3/uL (ref 0.7–4.0)
MCHC: 33.3 g/dL (ref 30.0–36.0)
MCV: 90.2 fL (ref 78.0–100.0)
Monocytes Absolute: 0.3 10*3/uL (ref 0.1–1.0)
Monocytes Relative: 4.6 % (ref 3.0–12.0)
Neutro Abs: 2.9 10*3/uL (ref 1.4–7.7)
Neutrophils Relative %: 43.9 % (ref 43.0–77.0)
Platelets: 355 10*3/uL (ref 150.0–400.0)
RBC: 4.08 Mil/uL (ref 3.87–5.11)
RDW: 13.5 % (ref 11.5–15.5)
WBC: 6.6 10*3/uL (ref 4.0–10.5)

## 2023-09-30 LAB — COMPREHENSIVE METABOLIC PANEL
ALT: 19 U/L (ref 0–35)
AST: 27 U/L (ref 0–37)
Albumin: 4.5 g/dL (ref 3.5–5.2)
Alkaline Phosphatase: 88 U/L (ref 39–117)
BUN: 9 mg/dL (ref 6–23)
CO2: 28 meq/L (ref 19–32)
Calcium: 9.1 mg/dL (ref 8.4–10.5)
Chloride: 99 meq/L (ref 96–112)
Creatinine, Ser: 0.76 mg/dL (ref 0.40–1.20)
GFR: 93.66 mL/min (ref >60.00)
Glucose, Bld: 85 mg/dL (ref 70–99)
Potassium: 3 meq/L — ABNORMAL LOW (ref 3.5–5.1)
Sodium: 138 meq/L (ref 135–145)
Total Bilirubin: 0.6 mg/dL (ref 0.2–1.2)
Total Protein: 8 g/dL (ref 6.0–8.3)

## 2023-09-30 LAB — LIPID PANEL
Cholesterol: 177 mg/dL (ref 0–200)
HDL: 70.9 mg/dL (ref >39.00)
LDL Cholesterol: 96 mg/dL (ref 0–99)
NonHDL: 105.67
Total CHOL/HDL Ratio: 2
Triglycerides: 49 mg/dL (ref 0.0–149.0)
VLDL: 9.8 mg/dL (ref 0.0–40.0)

## 2023-09-30 LAB — TSH: TSH: 4.41 u[IU]/mL (ref 0.35–5.50)

## 2023-09-30 LAB — FOLLICLE STIMULATING HORMONE: FSH: 21.3 m[IU]/mL

## 2023-09-30 LAB — HEMOGLOBIN A1C: Hgb A1c MFr Bld: 5.9 % (ref 4.6–6.5)

## 2023-09-30 MED ORDER — LISINOPRIL-HYDROCHLOROTHIAZIDE 20-12.5 MG PO TABS: 1.0000 | ORAL_TABLET | Freq: Every day | ORAL | 1 refills | Status: DC

## 2023-09-30 MED ORDER — HYDROXYZINE HCL 10 MG PO TABS: 10.0000 mg | ORAL_TABLET | Freq: Three times a day (TID) | ORAL | 2 refills | Status: AC | PRN

## 2023-09-30 NOTE — Assessment & Plan Note (Addendum)
chronic, waxing and waning, describes as racing thoughts and restlessness. does not want to take SSRI or buspar.  Denied need for therapy at this time Previous use of vistaril 25mg , but caused daytime drowsiness No tobacco or ALCOHOL or caffeine or illicit drug use Agreed to take vistaril 10mg  q8hprn.

## 2023-09-30 NOTE — Patient Instructions (Signed)
Go to lab maintain Heart healthy diet and daily exercise. Maintain current medications.  Preventive Care 41-46 Years Old, Female Preventive care refers to lifestyle choices and visits with your health care provider that can promote health and wellness. Preventive care visits are also called wellness exams. What can I expect for my preventive care visit? Counseling Your health care provider may ask you questions about your: Medical history, including: Past medical problems. Family medical history. Pregnancy history. Current health, including: Menstrual cycle. Method of birth control. Emotional well-being. Home life and relationship well-being. Sexual activity and sexual health. Lifestyle, including: Alcohol, nicotine or tobacco, and drug use. Access to firearms. Diet, exercise, and sleep habits. Work and work Astronomer. Sunscreen use. Safety issues such as seatbelt and bike helmet use. Physical exam Your health care provider will check your: Height and weight. These may be used to calculate your BMI (body mass index). BMI is a measurement that tells if you are at a healthy weight. Waist circumference. This measures the distance around your waistline. This measurement also tells if you are at a healthy weight and may help predict your risk of certain diseases, such as type 2 diabetes and high blood pressure. Heart rate and blood pressure. Body temperature. Skin for abnormal spots. What immunizations do I need?  Vaccines are usually given at various ages, according to a schedule. Your health care provider will recommend vaccines for you based on your age, medical history, and lifestyle or other factors, such as travel or where you work. What tests do I need? Screening Your health care provider may recommend screening tests for certain conditions. This may include: Lipid and cholesterol levels. Diabetes screening. This is done by checking your blood sugar (glucose) after you  have not eaten for a while (fasting). Pelvic exam and Pap test. Hepatitis B test. Hepatitis C test. HIV (human immunodeficiency virus) test. STI (sexually transmitted infection) testing, if you are at risk. Lung cancer screening. Colorectal cancer screening. Mammogram. Talk with your health care provider about when you should start having regular mammograms. This may depend on whether you have a family history of breast cancer. BRCA-related cancer screening. This may be done if you have a family history of breast, ovarian, tubal, or peritoneal cancers. Bone density scan. This is done to screen for osteoporosis. Talk with your health care provider about your test results, treatment options, and if necessary, the need for more tests. Follow these instructions at home: Eating and drinking  Eat a diet that includes fresh fruits and vegetables, whole grains, lean protein, and low-fat dairy products. Take vitamin and mineral supplements as recommended by your health care provider. Do not drink alcohol if: Your health care provider tells you not to drink. You are pregnant, may be pregnant, or are planning to become pregnant. If you drink alcohol: Limit how much you have to 0-1 drink a day. Know how much alcohol is in your drink. In the [place] one drink equals one 12 oz bottle of beer (355 mL), one 5 oz glass of wine (148 mL), or one 1 oz glass of hard liquor (44 mL). Lifestyle Brush your teeth every morning and night with fluoride toothpaste. Floss one time each day. Exercise for at least 30 minutes 5 or more days each week. Do not use any products that contain nicotine or tobacco. These products include cigarettes, chewing tobacco, and vaping devices, such as e-cigarettes. If you need help quitting, ask your health care provider. Do not use drugs. If you are  sexually active, practice safe sex. Use a condom or other form of protection to prevent STIs. If you do not wish to become pregnant, use  a form of birth control. If you plan to become pregnant, see your health care provider for a prepregnancy visit. Take aspirin only as told by your health care provider. Make sure that you understand how much to take and what form to take. Work with your health care provider to find out whether it is safe and beneficial for you to take aspirin daily. Find healthy ways to manage stress, such as: Meditation, yoga, or listening to music. Journaling. Talking to a trusted person. Spending time with friends and family. Minimize exposure to UV radiation to reduce your risk of skin cancer. Safety Always wear your seat belt while driving or riding in a vehicle. Do not drive: If you have been drinking alcohol. Do not ride with someone who has been drinking. When you are tired or distracted. While texting. If you have been using any mind-altering substances or drugs. Wear a helmet and other protective equipment during sports activities. If you have firearms in your house, make sure you follow all gun safety procedures. Seek help if you have been physically or sexually abused. What's next? Visit your health care provider once a year for an annual wellness visit. Ask your health care provider how often you should have your eyes and teeth checked. Stay up to date on all vaccines. This information is not intended to replace advice given to you by your health care provider. Make sure you discuss any questions you have with your health care provider. Document Revised: 04/04/2021 Document Reviewed: 04/04/2021 Elsevier Patient Education  2024 [employer].

## 2023-09-30 NOTE — Assessment & Plan Note (Signed)
Repeat hgbA1c 

## 2023-09-30 NOTE — Assessment & Plan Note (Signed)
Irregular menstrual cycle x 1year, LMP 08/06/2023. Associates with Hot flashes which interrupts sleep. S/p tubal ligation 34yrs ago Hx of PCOS, cycle had become more regular with diet modification and regular exercise.  Check THYROID, prolactin, estradiol, FSH, THYROID, cbc, CMP Decline urine cytology.

## 2023-09-30 NOTE — Progress Notes (Signed)
Complete physical exam  Patient: Kristin Lawrence   DOB: 1976-03-25   47 y.o. Female  MRN: [id number] Visit Date: 09/30/2023  Subjective:    Chief Complaint  Patient presents with   Annual Exam    Patient have menopause concerns     Kristin Lawrence is a 47 y.o. female who presents today for a complete physical exam. She reports consuming a general diet.  2-4x/week  She generally feels well. She reports sleeping fairly well. She does have additional problems to discuss today.  Vision:No Dental:Yes STD Screen:No  Mammogram done by Kristin Lawrence 12/2022.  BP Readings from Last 3 Encounters:  09/30/23 123/78  04/15/23 120/72  01/13/23 120/76   Wt Readings from Last 3 Encounters:  09/30/23 245 lb 12.8 oz (111.5 kg)  04/15/23 243 lb 6.4 oz (110.4 kg)  01/13/23 253 lb (114.8 kg)   Most recent fall risk assessment:    09/30/2023   10:01 AM  Fall Risk   Falls in the past year? 0  Number falls in past yr: 0  Injury with Fall? 0  Risk for fall due to : No Fall Risks  Follow up Falls evaluation completed   Depression screen:Yes - No Depression Most recent depression screenings:    09/30/2023   11:13 AM 04/15/2023    8:09 AM  PHQ 2/9 Scores  PHQ - 2 Score 0 0  PHQ- 9 Score 4       09/30/2023   11:14 AM 07/12/2022    9:33 AM  GAD 7 : Generalized Anxiety Score  Nervous, Anxious, on Edge 1 1  Control/stop worrying 1 1  Worry too much - different things 1 1  Trouble relaxing 0 1  Restless 0 1  Easily annoyed or irritable 1 1  Afraid - awful might happen 0 0  Total GAD 7 Score 4 6  Anxiety Difficulty Not difficult at all Not difficult at all   HPI  Irregular menstrual cycle Irregular menstrual cycle x 1year, LMP 08/06/2023. Associates with Hot flashes which interrupts sleep. S/p tubal ligation 28yrs ago Hx of PCOS, cycle had become more regular with diet modification and regular exercise.  Check THYROID, prolactin, estradiol, FSH, THYROID, cbc, CMP Decline urine  cytology.  Iron deficiency anemia due to chronic blood loss Repeat CBC  GAD (generalized anxiety disorder) chronic, waxing and waning, describes as racing thoughts and restlessness. does not want to take SSRI or buspar.  Denied need for therapy at this time Previous use of vistaril 25mg , but caused daytime drowsiness No tobacco or ALCOHOL or caffeine or illicit drug use Agreed to take vistaril 10mg  q8hprn.  Prediabetes Repeat hgbA1c   Past Medical History:  Diagnosis Date   Anemia    Anxiety    Benign hypertension    Galactorrhea    GERD (gastroesophageal reflux disease)    Helicobacter pylori ab+ 12/11/2009   Per Dr Kristin Lawrence note   IBS (irritable bowel syndrome)    Liver hemangioma    Lower extremity edema 10/10/2014   Formatting of this note might be different from the original. Last Assessment & Plan:  - Furosemide not recommended due to hypokalemia and concerns for renal function. Not likely to help long term. - Recommend medium strength compression stockings. Prescription given. - Recommend elevating legs when seated. - Recommend exercise and weight loss. - Denies dyspnea on exertion, orthopnea, and Lawrence   Obesity    Sleep apnea    Past Surgical History:  Procedure Laterality Date   DENTAL SURGERY  NASAL SINUS SURGERY     twice 2019 and 2011   TUBAL LIGATION     Social History   Socioeconomic History   Marital status: Divorced    Spouse name: Not on file   Number of children: Not on file   Years of education: Not on file   Highest education level: Some college, no degree  Occupational History   Occupation: Education officer, environmental   Tobacco Use   Smoking status: Former    Current packs/day: 0.00    Types: Cigarettes    Quit date: 03/20/1997    Years since quitting: 26.5   Smokeless tobacco: Never  Vaping Use   Vaping status: Never Used  Substance and Sexual Activity   Alcohol use: Not Currently   Drug use: No   Sexual activity: Yes    Birth  control/protection: Surgical  Other Topics Concern   Not on file  Social History Narrative   Works at [employer] as a Pensions consultant.  Lives with husband, Same sex domestic partner, and two sons 56, 75.   Daily caffeine    Social Determinants of Health   Financial Resource Strain: Low Risk  (09/29/2023)   Overall Financial Resource Strain (CARDIA)    Difficulty of Paying Living Expenses: Not very hard  Food Insecurity: No Food Insecurity (09/29/2023)   Hunger Vital Sign    Worried About Running Out of Food in the Last Year: Never true    Ran Out of Food in the Last Year: Never true  Transportation Needs: No Transportation Needs (09/29/2023)   PRAPARE - Administrator, Civil Service (Medical): No    Lack of Transportation (Non-Medical): No  Physical Activity: Sufficiently Active (09/29/2023)   Exercise Vital Sign    Days of Exercise per Week: 3 days    Minutes of Exercise per Session: 150+ min  Stress: No Stress Concern Present (09/29/2023)   [employer] - Occupational Stress Questionnaire    Feeling of Stress : Only a little  Social Connections: Socially Isolated (09/29/2023)   Social Connection and Isolation Panel [NHANES]    Frequency of Communication with Friends and Family: More than three times a week    Frequency of Social Gatherings with Friends and Family: Once a week    Attends Religious Services: Never    Database administrator or Organizations: No    Attends Engineer, structural: Not on file    Marital Status: Divorced  Catering manager Violence: Not on file   Family Status  Relation Name Status   Mother  Alive   Father  Alive   Sister  Alive   Mat Uncle  (Not Specified)   Kristin Lawrence  (Not Specified)   MGM  (Not Specified)   Mat Uncle  (Not Specified)   Mat Uncle  (Not Specified)   PGF  (Not Specified)   Kristin Bible Aunt  (Not Specified)   Neg Hx  (Not Specified)  No partnership data on file   Family History  Problem  Relation Age of Onset   Heart disease Mother 22       MI?   Colon cancer Mother 37   Cystic fibrosis Mother    Anuerysm Mother    Colon polyps Mother    Stroke Maternal Uncle    Stroke Paternal Uncle    Pancreatic cancer Maternal Grandmother    Diabetes Maternal Grandmother    Esophageal cancer Maternal Uncle    Liver cancer Maternal Uncle  Stomach cancer Paternal Grandfather    Breast cancer Paternal Aunt        20's   Rectal cancer Neg Hx    No Known Allergies  Patient Care Team: Taralynn Quiett, Bonna Gains, NP as PCP - General (Internal Medicine)   Medications: Outpatient Medications Prior to Visit  Medication Sig   Cyanocobalamin (VITAMIN B 12 PO) Take by mouth.   Ferrous Sulfate (IRON PO) Take by mouth.   FOLIC ACID PO Take by mouth.   omeprazole (PRILOSEC OTC) 20 MG tablet Take 20 mg by mouth daily.   [DISCONTINUED] HYDROXYZINE HCL PO Take by mouth.   [DISCONTINUED] lisinopril-hydrochlorothiazide (ZESTORETIC) 20-12.5 MG tablet Take 1 tablet by mouth daily.   No facility-administered medications prior to visit.    Review of Systems  Constitutional:  Negative for activity change, appetite change and unexpected weight change.  Respiratory: Negative.    Cardiovascular: Negative.   Gastrointestinal: Negative.   Endocrine: Negative for cold intolerance and heat intolerance.  Genitourinary: Negative.   Musculoskeletal: Negative.   Skin: Negative.   Neurological: Negative.   Hematological: Negative.   Psychiatric/Behavioral:  Negative for behavioral problems, decreased concentration, dysphoric mood, hallucinations, self-injury, sleep disturbance and suicidal ideas. The patient is not nervous/anxious.         Objective:  BP 123/78 (BP Location: Left Arm, Patient Position: Sitting, Cuff Size: Large)   Pulse (!) 54   Temp 98.2 F (36.8 C) (Temporal)   Resp 18   Ht 5\' 6"  (1.676 m)   Wt 245 lb 12.8 oz (111.5 kg)   LMP 08/06/2023 (Exact Date) Comment: none for novemember  most recent was in October 16,2024  SpO2 100%   BMI 39.67 kg/m     Physical Exam Vitals and nursing note reviewed.  Constitutional:      General: She is not in acute distress. HENT:     Right Ear: Tympanic membrane, ear canal and external ear normal.     Left Ear: Tympanic membrane, ear canal and external ear normal.     Nose: Nose normal.  Eyes:     Extraocular Movements: Extraocular movements intact.     Conjunctiva/sclera: Conjunctivae normal.     Pupils: Pupils are equal, round, and reactive to light.  Neck:     Thyroid: No thyroid mass, thyromegaly or thyroid tenderness.  Cardiovascular:     Rate and Rhythm: Normal rate and regular rhythm.     Pulses: Normal pulses.     Heart sounds: Normal heart sounds.  Pulmonary:     Effort: Pulmonary effort is normal.     Breath sounds: Normal breath sounds.  Abdominal:     General: Bowel sounds are normal.     Palpations: Abdomen is soft.  Musculoskeletal:        General: Normal range of motion.     Cervical back: Normal range of motion and neck supple.     Right lower leg: No edema.     Left lower leg: No edema.  Lymphadenopathy:     Cervical: No cervical adenopathy.  Skin:    General: Skin is warm and dry.  Neurological:     Mental Status: She is alert and oriented to person, place, and time.     Cranial Nerves: No cranial nerve deficit.  Psychiatric:        Mood and Affect: Mood normal.        Behavior: Behavior normal.        Thought Content: Thought content normal.  No results found for any visits on 09/30/23.    Assessment & Plan:    Routine Health Maintenance and Physical Exam  Immunization History  Administered Date(s) Administered   Influenza-Unspecified 07/22/2019   PFIZER(Purple Top)SARS-COV-2 Vaccination 02/10/2020   Td 10/24/2002   Tdap 11/29/2019   Health Maintenance  Topic Date Due   INFLUENZA VACCINE  01/19/2024 (Originally 05/22/2023)   Colonoscopy  01/09/2025   Cervical Cancer Screening  (HPV/Pap Cotest)  07/11/2026   DTaP/Tdap/Td (3 - Td or Tdap) 11/28/2029   Hepatitis C Screening  Completed   HIV Screening  Completed   HPV VACCINES  Aged Out   COVID-19 Vaccine  Discontinued    Discussed health benefits of physical activity, and encouraged her to engage in regular exercise appropriate for her age and condition.  Problem List Items Addressed This Visit     Benign essential hypertension   Relevant Medications   lisinopril-hydrochlorothiazide (ZESTORETIC) 20-12.5 MG tablet   GAD (generalized anxiety disorder)    chronic, waxing and waning, describes as racing thoughts and restlessness. does not want to take SSRI or buspar.  Denied need for therapy at this time Previous use of vistaril 25mg , but caused daytime drowsiness No tobacco or ALCOHOL or caffeine or illicit drug use Agreed to take vistaril 10mg  q8hprn.      Relevant Medications   hydrOXYzine (ATARAX) 10 MG tablet   Iron deficiency anemia due to chronic blood loss    Repeat CBC      Relevant Orders   CBC with Differential/Platelet   Irregular menstrual cycle    Irregular menstrual cycle x 1year, LMP 08/06/2023. Associates with Hot flashes which interrupts sleep. S/p tubal ligation 33yrs ago Hx of PCOS, cycle had become more regular with diet modification and regular exercise.  Check THYROID, prolactin, estradiol, FSH, THYROID, cbc, CMP Decline urine cytology.      Relevant Orders   FSH   TSH   Estradiol   Prolactin   Prediabetes    Repeat hgbA1c      Relevant Orders   Hemoglobin A1c   Other Visit Diagnoses     Encounter for preventative adult health care exam with abnormal findings    -  Primary   Relevant Orders   Comprehensive metabolic panel   Encounter for screening for viral disease       Relevant Orders   HIV Antibody (routine testing w rflx)   RPR   Hepatitis C antibody   Hep B Core Ab W/Reflex   Encounter for lipid screening for cardiovascular disease       Relevant Orders    Lipid panel      Return in about 6 months (around 03/30/2024) for HTN, prediabetes, hyperlipidemia (fasting).     Alysia Penna, NP

## 2023-09-30 NOTE — Assessment & Plan Note (Signed)
Repeat CBC

## 2023-10-01 LAB — HEPATITIS B CORE AB W/REFLEX: Hep B Core Total Ab: NEGATIVE

## 2023-10-02 MED ORDER — POTASSIUM CHLORIDE CRYS ER 20 MEQ PO TBCR: 20.0000 meq | EXTENDED_RELEASE_TABLET | Freq: Two times a day (BID) | ORAL | 0 refills | Status: DC

## 2023-10-02 NOTE — Addendum Note (Signed)
Addended by: Alysia Penna L on: 10/02/2023 12:44 PM   Modules accepted: Orders

## 2023-10-03 ENCOUNTER — Telehealth: Payer: Self-pay | Admitting: Nurse Practitioner

## 2023-10-03 NOTE — Telephone Encounter (Signed)
LVM to CB Returned call to discuss Hep C lab results

## 2023-10-03 NOTE — Telephone Encounter (Signed)
-----   Message from [facility] Ashlee G sent at 10/03/2023  9:15 AM EST ----- Regarding: labs Spoke to patient and relied message below; she would like a call back, requesting education regarding hepatis C results. ----- Message ----- From: Anne Ng, NP Sent: 10/02/2023  12:44 PM EST To: Leata Mouse Gable, CMA  LVM to CB Positive Hep. C test: need to repeat test in 44month. Need to verify old infection or active infection. Schedule lab appointment Mild decrease in potassium: PRESCRIPTION sent Hormonal levels do not indicate menopause Normal lipid panel, CBC, renal, THYROID and liver function Negative HIV, syphilis, and hep. B

## 2023-10-05 LAB — RPR: RPR Ser Ql: NONREACTIVE

## 2023-10-05 LAB — PROLACTIN: Prolactin: 16.1 ng/mL

## 2023-10-05 LAB — ESTRADIOL: Estradiol: 90 pg/mL

## 2023-10-05 LAB — HIV ANTIBODY (ROUTINE TESTING W REFLEX): HIV 1&2 Ab, 4th Generation: NONREACTIVE

## 2023-10-05 LAB — HCV RNA,QUANTITATIVE REAL TIME PCR
HCV Quantitative Log: <1.18 {Log}
HCV RNA, PCR, QN: <15 [IU]/mL

## 2023-10-05 LAB — HEPATITIS C ANTIBODY: Hepatitis C Ab: BORDERLINE — AB

## 2023-10-07 ENCOUNTER — Emergency Department (HOSPITAL_BASED_OUTPATIENT_CLINIC_OR_DEPARTMENT_OTHER)
Admission: EM | Admit: 2023-10-07 | Discharge: 2023-10-08 | Disposition: A | Discharge status: Home / Self Care | Payer: Managed Care, Other (non HMO) | Attending: Emergency Medicine | Admitting: Emergency Medicine

## 2023-10-07 ENCOUNTER — Encounter (HOSPITAL_BASED_OUTPATIENT_CLINIC_OR_DEPARTMENT_OTHER): Payer: Self-pay | Admitting: Emergency Medicine

## 2023-10-07 ENCOUNTER — Other Ambulatory Visit: Payer: Self-pay

## 2023-10-07 DIAGNOSIS — Z1152 Encounter for screening for COVID-19: Secondary | ICD-10-CM | POA: Insufficient documentation

## 2023-10-07 DIAGNOSIS — J069 Acute upper respiratory infection, unspecified: Secondary | ICD-10-CM | POA: Diagnosis not present

## 2023-10-07 DIAGNOSIS — R059 Cough, unspecified: Secondary | ICD-10-CM | POA: Diagnosis present

## 2023-10-07 HISTORY — DX: Unspecified viral hepatitis C without hepatic coma: B19.20

## 2023-10-07 LAB — RESP PANEL BY RT-PCR (RSV, FLU A&B, COVID)  RVPGX2
Influenza A by PCR: NEGATIVE
Influenza B by PCR: NEGATIVE
Resp Syncytial Virus by PCR: NEGATIVE
SARS Coronavirus 2 by RT PCR: NEGATIVE

## 2023-10-07 NOTE — ED Triage Notes (Signed)
Started Friday  Productive Cough, headache swelling lymphs, runny nose and eyes

## 2023-10-08 ENCOUNTER — Telehealth: Payer: Self-pay

## 2023-10-08 NOTE — ED Provider Notes (Signed)
[place] EMERGENCY DEPARTMENT AT [facility]  Provider Note  CSN: [id number] Arrival date & time: 10/07/23 2044  History No chief complaint on file.   Kristin Lawrence is a 47 y.o. female reports three days of URI symptoms with cough, congestion, and swollen lymph nodes. No fevers.    Home Medications Prior to Admission medications   Medication Sig Start Date End Date Taking? Authorizing Provider  Cyanocobalamin (VITAMIN B 12 PO) Take by mouth.    [provider]  Ferrous Sulfate (IRON PO) Take by mouth.    [provider]  FOLIC ACID PO Take by mouth.    [provider]  hydrOXYzine (ATARAX) 10 MG tablet Take 1 tablet (10 mg total) by mouth every 8 (eight) hours as needed. 09/30/23   Nche, Bonna Gains, NP  lisinopril-hydrochlorothiazide (ZESTORETIC) 20-12.5 MG tablet Take 1 tablet by mouth daily. 09/30/23   Nche, Bonna Gains, NP  omeprazole (PRILOSEC OTC) 20 MG tablet Take 20 mg by mouth daily.    [provider]  potassium chloride SA (KLOR-CON M) 20 MEQ tablet Take 1 tablet (20 mEq total) by mouth 2 (two) times daily. 10/02/23   Nche, Bonna Gains, NP     Allergies    Patient has no known allergies.   Review of Systems   Review of Systems Please see HPI for pertinent positives and negatives  Physical Exam BP 134/82   Pulse 62   Temp 98.2 F (36.8 C)   Resp 18   LMP 09/30/2023 (Exact Date)   SpO2 98%   Physical Exam Vitals and nursing note reviewed.  Constitutional:      Appearance: Normal appearance.  HENT:     Head: Normocephalic and atraumatic.     Nose: Nose normal.     Mouth/Throat:     Mouth: Mucous membranes are moist.  Eyes:     Extraocular Movements: Extraocular movements intact.     Conjunctiva/sclera: Conjunctivae normal.  Cardiovascular:     Rate and Rhythm: Normal rate.  Pulmonary:     Effort: Pulmonary effort is normal.     Breath sounds: Normal breath sounds. No wheezing or rales.   Abdominal:     General: Abdomen is flat.     Palpations: Abdomen is soft.     Tenderness: There is no abdominal tenderness.  Musculoskeletal:        General: No swelling. Normal range of motion.     Cervical back: Neck supple.  Lymphadenopathy:     Cervical: No cervical adenopathy.  Skin:    General: Skin is warm and dry.  Neurological:     General: No focal deficit present.     Mental Status: She is alert.  Psychiatric:        Mood and Affect: Mood normal.     ED Results / Procedures / Treatments   EKG None  Procedures Procedures  Medications Ordered in the ED Medications - No data to display  Initial Impression and Plan  Patient with viral URI symptoms, well appearing with reassuring vitals and exam. Covid/Flu/RSV swab is negative. Recommend continued supportive care with OTC cold medications. PCP follow up, RTED for any other concerns.    ED Course       MDM Rules/Calculators/A&P Medical Decision Making Problems Addressed: Viral URI with cough: acute illness or injury  Amount and/or Complexity of Data Reviewed Labs: ordered. Decision-making details documented in ED Course.  Risk OTC drugs.     Final Clinical Impression(s) / ED Diagnoses  Final diagnoses:  Viral URI with cough    Rx / DC Orders ED Discharge Orders     None        Pollyann Savoy, MD 10/08/23 [phone]

## 2023-10-08 NOTE — Transitions of Care (Post Inpatient/ED Visit) (Unsigned)
   10/08/2023  Name: Kristin Lawrence MRN: [id number] DOB: [birth year]  Today's TOC FU Call Status: Today's TOC FU Call Status:: Unsuccessful Call (1st Attempt) Unsuccessful Call (1st Attempt) Date: 10/08/23  Attempted to reach the patient regarding the most recent Inpatient/ED visit.  Follow Up Plan: Additional outreach attempts will be made to reach the patient to complete the Transitions of Care (Post Inpatient/ED visit) call.   Signature Arvil Persons, BSN, Charity fundraiser

## 2023-10-08 NOTE — ED Notes (Signed)
ED Provider at bedside. 

## 2023-10-10 NOTE — Transitions of Care (Post Inpatient/ED Visit) (Unsigned)
   10/10/2023  Name: Kristin Lawrence MRN: [id number] DOB: [birth year]  Today's TOC FU Call Status: Today's TOC FU Call Status:: Unsuccessful Call (2nd Attempt) Unsuccessful Call (1st Attempt) Date: 10/08/23 Unsuccessful Call (2nd Attempt) Date: 10/10/23  Attempted to reach the patient regarding the most recent Inpatient/ED visit.  Follow Up Plan: Additional outreach attempts will be made to reach the patient to complete the Transitions of Care (Post Inpatient/ED visit) call.   Signature Arvil Persons, BSN, Charity fundraiser

## 2023-10-13 NOTE — Transitions of Care (Post Inpatient/ED Visit) (Signed)
   10/13/2023  Name: Kristin Lawrence MRN: [id number] DOB: 10/09/1976  Today's TOC FU Call Status: Today's TOC FU Call Status:: Unsuccessful Call (3rd Attempt) Unsuccessful Call (1st Attempt) Date: 10/08/23 Unsuccessful Call (2nd Attempt) Date: 10/10/23 Unsuccessful Call (3rd Attempt) Date: 10/13/23  Attempted to reach the patient regarding the most recent Inpatient/ED visit.  Follow Up Plan: No further outreach attempts will be made at this time. We have been unable to contact the patient.  Signature Arvil Persons, BSN, Charity fundraiser

## 2023-11-03 ENCOUNTER — Other Ambulatory Visit (INDEPENDENT_AMBULATORY_CARE_PROVIDER_SITE_OTHER): Payer: Managed Care, Other (non HMO)

## 2023-11-03 DIAGNOSIS — R768 Other specified abnormal immunological findings in serum: Secondary | ICD-10-CM

## 2023-11-05 LAB — HEPATITIS C RNA QUANTITATIVE
HCV Quantitative Log: <1.18 {Log}
HCV RNA, PCR, QN: <15 [IU]/mL

## 2023-11-05 LAB — HEPATITIS C ANTIBODY: Hepatitis C Ab: NONREACTIVE

## 2024-02-02 LAB — HM MAMMOGRAPHY

## 2024-03-19 ENCOUNTER — Other Ambulatory Visit: Payer: Self-pay | Admitting: Nurse Practitioner

## 2024-03-19 DIAGNOSIS — I10 Essential (primary) hypertension: Secondary | ICD-10-CM

## 2024-05-25 ENCOUNTER — Ambulatory Visit: Admitting: Nurse Practitioner

## 2024-05-25 ENCOUNTER — Other Ambulatory Visit (HOSPITAL_COMMUNITY)
Admission: RE | Admit: 2024-05-25 | Discharge: 2024-05-25 | Disposition: A | Discharge status: Home / Self Care | Source: Ambulatory Visit | Attending: Nurse Practitioner | Admitting: Nurse Practitioner

## 2024-05-25 VITALS — BP 128/78 | HR 55 | Temp 98.2°F | Ht 66.5 in | Wt 250.8 lb

## 2024-05-25 DIAGNOSIS — R7303 Prediabetes: Secondary | ICD-10-CM | POA: Diagnosis not present

## 2024-05-25 DIAGNOSIS — E876 Hypokalemia: Secondary | ICD-10-CM | POA: Diagnosis not present

## 2024-05-25 DIAGNOSIS — Z113 Encounter for screening for infections with a predominantly sexual mode of transmission: Secondary | ICD-10-CM

## 2024-05-25 DIAGNOSIS — I10 Essential (primary) hypertension: Secondary | ICD-10-CM | POA: Diagnosis not present

## 2024-05-25 DIAGNOSIS — R102 Pelvic and perineal pain: Secondary | ICD-10-CM

## 2024-05-25 LAB — BASIC METABOLIC PANEL WITH GFR
BUN: 10 mg/dL (ref 6–23)
CO2: 32 meq/L (ref 19–32)
Calcium: 9.5 mg/dL (ref 8.4–10.5)
Chloride: 99 meq/L (ref 96–112)
Creatinine, Ser: 0.74 mg/dL (ref 0.40–1.20)
GFR: 96.26 mL/min (ref >60.00)
Glucose, Bld: 87 mg/dL (ref 70–99)
Potassium: 3.7 meq/L (ref 3.5–5.1)
Sodium: 139 meq/L (ref 135–145)

## 2024-05-25 LAB — HEMOGLOBIN A1C: Hgb A1c MFr Bld: 6.3 % (ref 4.6–6.5)

## 2024-05-25 NOTE — Patient Instructions (Signed)
 Go to lab Maintain Heart healthy diet and daily exercise. Maintain current medications.

## 2024-05-25 NOTE — Progress Notes (Unsigned)
 Established Patient Visit  Patient: Kristin Lawrence   DOB: Oct 02, 1976   48 y.o. Female  MRN: [id number] Visit Date: 05/27/2024  Subjective:    Chief Complaint  Patient presents with   Follow-up    6 month follow up for HTN    STI/STD testing     Wants to have a STI/STD check to stay up on status    Pelvic Pain The patient's primary symptoms include pelvic pain. This is a new problem. The current episode started more than 1 month ago. The problem occurs intermittently. The problem has been waxing and waning. The pain is mild. The problem affects the right side. She is not pregnant. Pertinent negatives include no abdominal pain, anorexia, back pain, chills, constipation, diarrhea, discolored urine, dysuria, fever, flank pain, frequency, headaches, hematuria, joint pain, joint swelling, nausea, painful intercourse, rash, sore throat, urgency or vomiting. Exacerbated by: menstrual cycle. She has tried nothing for the symptoms. She is sexually active. It is unknown whether or not her partner has an STD. She uses tubal ligation for contraception. Her menstrual history has been regular. Her past medical history is significant for ovarian cysts. There is no history of an abdominal surgery, endometriosis, menorrhagia, PID, an STD or vaginosis.   Benign essential hypertension BP at goal with lisinopril /hctz  BP Readings from Last 3 Encounters:  05/25/24 128/78  10/08/23 126/80  09/30/23 123/78    Maintain med dose F/up in 3-50months   Prediabetes Repeat hgbA1c: 6.3% F/up in 3months  Pelvic pain Right side, onset 2months ago, occurs with menstrual cycle Hx of PCOS, s/p tubal ligation She declined pelvic US  at this time  Reviewed medical, surgical, and social history today  Medications: Outpatient Medications Prior to Visit  Medication Sig   Cyanocobalamin (VITAMIN B 12 PO) Take by mouth.   Ferrous Sulfate (IRON PO) Take by mouth.   FOLIC ACID PO Take by mouth.   hydrOXYzine   (ATARAX ) 10 MG tablet Take 1 tablet (10 mg total) by mouth every 8 (eight) hours as needed.   omeprazole  (PRILOSEC OTC) 20 MG tablet Take 20 mg by mouth daily.   [DISCONTINUED] lisinopril -hydrochlorothiazide  (ZESTORETIC ) 20-12.5 MG tablet Take 1 tablet by mouth once daily   [DISCONTINUED] potassium chloride  SA (KLOR-CON  M) 20 MEQ tablet Take 1 tablet (20 mEq total) by mouth 2 (two) times daily. (Patient taking differently: Take 20 mEq by mouth daily.)   No facility-administered medications prior to visit.   Reviewed past medical and social history.   ROS per HPI above      Objective:  BP 128/78 (BP Location: Left Arm, Patient Position: Sitting, Cuff Size: Large)   Pulse (!) 55   Temp 98.2 F (36.8 C) (Oral)   Ht 5' 6.5 (1.689 m)   Wt 250 lb 12.8 oz (113.8 kg)   LMP 05/19/2024 (Exact Date)   SpO2 98%   BMI 39.87 kg/m      Physical Exam Vitals and nursing note reviewed.  Cardiovascular:     Rate and Rhythm: Normal rate.     Pulses: Normal pulses.  Pulmonary:     Effort: Pulmonary effort is normal.  Musculoskeletal:     Right lower leg: No edema.     Left lower leg: No edema.  Neurological:     Mental Status: She is alert and oriented to person, place, and time.     Results for orders placed or performed in visit on  05/25/24  Basic metabolic panel with GFR  Result Value Ref Range   Sodium 139 135 - 145 mEq/L   Potassium 3.7 3.5 - 5.1 mEq/L   Chloride 99 96 - 112 mEq/L   CO2 32 19 - 32 mEq/L   Glucose, Bld 87 70 - 99 mg/dL   BUN 10 6 - 23 mg/dL   Creatinine, Ser 9.25 0.40 - 1.20 mg/dL   GFR 03.73 >39.99 mL/min   Calcium 9.5 8.4 - 10.5 mg/dL  Hemoglobin J8r  Result Value Ref Range   Hgb A1c MFr Bld 6.3 4.6 - 6.5 %  RPR  Result Value Ref Range   RPR Ser Ql NON-REACTIVE NON-REACTIVE  HIV Antibody (routine testing w rflx)  Result Value Ref Range   HIV FINAL INTERPRETATION     HIV 1&2 Ab, 4th Generation NON-REACTIVE NON-REACTIVE  Urine cytology ancillary only   Result Value Ref Range   Neisseria Gonorrhea Negative    Chlamydia Negative    Trichomonas Negative    Comment Normal Reference Range Trichomonas - Negative    Comment Normal Reference Ranger Chlamydia - Negative    Comment      Normal Reference Range Neisseria Gonorrhea - Negative      Assessment & Plan:    Problem List Items Addressed This Visit     Benign essential hypertension - Primary   BP at goal with lisinopril /hctz  BP Readings from Last 3 Encounters:  05/25/24 128/78  10/08/23 126/80  09/30/23 123/78    Maintain med dose F/up in 3-64months       Relevant Medications   lisinopril -hydrochlorothiazide  (ZESTORETIC ) 20-12.5 MG tablet   Other Relevant Orders   Basic metabolic panel with GFR (Completed)   Pelvic pain   Right side, onset 2months ago, occurs with menstrual cycle Hx of PCOS, s/p tubal ligation She declined pelvic US  at this time      Prediabetes   Repeat hgbA1c: 6.3% F/up in 3months      Relevant Orders   Hemoglobin A1c (Completed)   Other Visit Diagnoses       Hypokalemia       Relevant Orders   Basic metabolic panel with GFR (Completed)     Screen for STD (sexually transmitted disease)       Relevant Orders   RPR (Completed)   HIV Antibody (routine testing w rflx) (Completed)   Urine cytology ancillary only (Completed)      Return in about 6 months (around 11/25/2024) for CPE (fasting).     Roselie Mood, NP

## 2024-05-25 NOTE — Assessment & Plan Note (Signed)
 BP at goal with lisinopril /hctz  BP Readings from Last 3 Encounters:  05/25/24 128/78  10/08/23 126/80  09/30/23 123/78    Maintain med dose F/up in 3-20months

## 2024-05-26 LAB — URINE CYTOLOGY ANCILLARY ONLY
Chlamydia: NEGATIVE
Comment: NEGATIVE
Comment: NEGATIVE
Comment: NORMAL
Neisseria Gonorrhea: NEGATIVE
Trichomonas: NEGATIVE

## 2024-05-26 LAB — HIV ANTIBODY (ROUTINE TESTING W REFLEX): HIV 1&2 Ab, 4th Generation: NONREACTIVE

## 2024-05-26 LAB — RPR: RPR Ser Ql: NONREACTIVE

## 2024-05-27 ENCOUNTER — Encounter: Payer: Self-pay | Admitting: Nurse Practitioner

## 2024-05-27 ENCOUNTER — Ambulatory Visit: Payer: Self-pay | Admitting: Nurse Practitioner

## 2024-05-27 DIAGNOSIS — R102 Pelvic and perineal pain: Secondary | ICD-10-CM | POA: Insufficient documentation

## 2024-05-27 MED ORDER — LISINOPRIL-HYDROCHLOROTHIAZIDE 20-12.5 MG PO TABS: 1.0000 | ORAL_TABLET | Freq: Every day | ORAL | 1 refills | Status: DC

## 2024-05-27 NOTE — Assessment & Plan Note (Signed)
 Right side, onset 2months ago, occurs with menstrual cycle Hx of PCOS, s/p tubal ligation She declined pelvic US  at this time

## 2024-05-27 NOTE — Assessment & Plan Note (Addendum)
 Repeat hgbA1c: 6.3% F/up in 3months

## 2024-11-18 ENCOUNTER — Ambulatory Visit: Payer: Self-pay | Admitting: Nurse Practitioner

## 2024-11-18 ENCOUNTER — Encounter: Payer: Self-pay | Admitting: Gastroenterology

## 2024-11-18 ENCOUNTER — Ambulatory Visit: Admitting: Nurse Practitioner

## 2024-11-18 VITALS — BP 126/78 | HR 60 | Temp 97.6°F | Ht 66.0 in | Wt 253.0 lb

## 2024-11-18 DIAGNOSIS — Z136 Encounter for screening for cardiovascular disorders: Secondary | ICD-10-CM

## 2024-11-18 DIAGNOSIS — Z0000 Encounter for general adult medical examination without abnormal findings: Secondary | ICD-10-CM

## 2024-11-18 DIAGNOSIS — R7303 Prediabetes: Secondary | ICD-10-CM

## 2024-11-18 DIAGNOSIS — Z13220 Encounter for screening for lipoid disorders: Secondary | ICD-10-CM | POA: Diagnosis not present

## 2024-11-18 DIAGNOSIS — I10 Essential (primary) hypertension: Secondary | ICD-10-CM | POA: Diagnosis not present

## 2024-11-18 DIAGNOSIS — Z0001 Encounter for general adult medical examination with abnormal findings: Secondary | ICD-10-CM

## 2024-11-18 LAB — LIPID PANEL
Cholesterol: 180 mg/dL (ref 28–200)
HDL: 68 mg/dL
LDL Cholesterol: 104 mg/dL — ABNORMAL HIGH (ref 10–99)
NonHDL: 111.66
Total CHOL/HDL Ratio: 3
Triglycerides: 36 mg/dL (ref 10.0–149.0)
VLDL: 7.2 mg/dL (ref 0.0–40.0)

## 2024-11-18 LAB — COMPREHENSIVE METABOLIC PANEL WITH GFR
ALT: 18 U/L (ref 3–35)
AST: 25 U/L (ref 5–37)
Albumin: 4.4 g/dL (ref 3.5–5.2)
Alkaline Phosphatase: 73 U/L (ref 39–117)
BUN: 15 mg/dL (ref 6–23)
CO2: 33 meq/L — ABNORMAL HIGH (ref 19–32)
Calcium: 9.5 mg/dL (ref 8.4–10.5)
Chloride: 103 meq/L (ref 96–112)
Creatinine, Ser: 0.63 mg/dL (ref 0.40–1.20)
GFR: 105.18 mL/min
Glucose, Bld: 94 mg/dL (ref 70–99)
Potassium: 4.1 meq/L (ref 3.5–5.1)
Sodium: 141 meq/L (ref 135–145)
Total Bilirubin: 0.4 mg/dL (ref 0.2–1.2)
Total Protein: 8 g/dL (ref 6.0–8.3)

## 2024-11-18 LAB — HEMOGLOBIN A1C: Hgb A1c MFr Bld: 5.9 % (ref 4.6–6.5)

## 2024-11-18 MED ORDER — LISINOPRIL-HYDROCHLOROTHIAZIDE 20-12.5 MG PO TABS: 1.0000 | ORAL_TABLET | Freq: Every day | ORAL | 1 refills | Status: AC

## 2024-11-18 NOTE — Progress Notes (Signed)
 "  Complete physical exam  Patient: Kristin Lawrence   DOB: 09/22/1976   49 y.o. Female  MRN: [id number] Visit Date: 11/18/2024  Subjective:    Chief Complaint  Patient presents with   Annual Exam    FASTING  DUE for colonoscopy    Kristin Lawrence is a 49 y.o. female who presents today for a complete physical exam. She reports consuming a low fat diet. Gym/ health club routine includes cardio and light weights. She generally feels well. She reports sleeping well. She does have additional problems to discuss today.  Vision:No Dental:Yes STD Screen:No  BP Readings from Last 3 Encounters:  11/18/24 126/78  05/25/24 128/78  10/08/23 126/80   Wt Readings from Last 3 Encounters:  11/18/24 253 lb (114.8 kg)  05/25/24 250 lb 12.8 oz (113.8 kg)  09/30/23 245 lb 12.8 oz (111.5 kg)   Most recent fall risk assessment:    11/18/2024   10:14 AM  Fall Risk   Falls in the past year? 0  Number falls in past yr: 0  Injury with Fall? 0  Follow up Falls evaluation completed   Depression screen:Yes - No Depression Most recent depression screenings:    11/18/2024   10:14 AM 09/30/2023   11:13 AM  PHQ 2/9 Scores  PHQ - 2 Score 1 0  PHQ- 9 Score 5 4      Data saved with a previous flowsheet row definition   HPI  Benign essential hypertension BP at goal with lisinopril /hctz  BP Readings from Last 3 Encounters:  11/18/24 126/78  05/25/24 128/78  10/08/23 126/80    Maintain med dose, refill sent F/up in 6months   Prediabetes Repeat hgbA1c Advised about the importance of heart healthy diet F/up in 3-24months   Past Medical History:  Diagnosis Date   Anemia    Anxiety    Benign hypertension    Galactorrhea    GERD (gastroesophageal reflux disease)    Helicobacter pylori ab+ 12/11/2009   Per Dr Ethelda note   Hepatitis C    IBS (irritable bowel syndrome)    Liver hemangioma    Lower extremity edema 10/10/2014   Formatting of this note might be different from the  original. Last Assessment & Plan:  - Furosemide not recommended due to hypokalemia and concerns for renal function. Not likely to help long term. - Recommend medium strength compression stockings. Prescription given. - Recommend elevating legs when seated. - Recommend exercise and weight loss. - Denies dyspnea on exertion, orthopnea, and pa   Obesity    Sleep apnea    Past Surgical History:  Procedure Laterality Date   DENTAL SURGERY     NASAL SINUS SURGERY     twice 2019 and 2011   TUBAL LIGATION     Social History   Socioeconomic History   Marital status: Divorced    Spouse name: Not on file   Number of children: Not on file   Years of education: Not on file   Highest education level: GED or equivalent  Occupational History   Occupation: Education Officer, Environmental   Tobacco Use   Smoking status: Former    Current packs/day: 0.00    Types: Cigarettes    Quit date: 03/20/1997    Years since quitting: 27.6   Smokeless tobacco: Never  Vaping Use   Vaping status: Never Used  Substance and Sexual Activity   Alcohol use: Not Currently   Drug use: Never   Sexual activity: Yes    Birth  control/protection: Surgical  Other Topics Concern   Not on file  Social History Narrative   Works at [employer] as a pensions consultant.  Lives with husband, Same sex domestic partner, and two sons 48, 52.   Daily caffeine    Social Drivers of Health   Tobacco Use: Medium Risk (11/18/2024)   Patient History    Smoking Tobacco Use: Former    Smokeless Tobacco Use: Never    Passive Exposure: Not on file  Financial Resource Strain: Low Risk (11/18/2024)   Overall Financial Resource Strain (CARDIA)    Difficulty of Paying Living Expenses: Not very hard  Food Insecurity: No Food Insecurity (11/18/2024)   Epic    Worried About Programme Researcher, Broadcasting/film/video in the Last Year: Never true    Ran Out of Food in the Last Year: Never true  Transportation Needs: No Transportation Needs (11/18/2024)   Epic    Lack of  Transportation (Medical): No    Lack of Transportation (Non-Medical): No  Physical Activity: Sufficiently Active (11/18/2024)   Exercise Vital Sign    Days of Exercise per Week: 3 days    Minutes of Exercise per Session: 80 min  Stress: Stress Concern Present (11/18/2024)   [employer] of Occupational Health - Occupational Stress Questionnaire    Feeling of Stress: To some extent  Social Connections: Socially Isolated (11/18/2024)   Social Connection and Isolation Panel    Frequency of Communication with Friends and Family: More than three times a week    Frequency of Social Gatherings with Friends and Family: Once a week    Attends Religious Services: Never    Database Administrator or Organizations: No    Attends Engineer, Structural: Not on file    Marital Status: Divorced  Intimate Partner Violence: Not on file  Depression (PHQ2-9): Medium Risk (11/18/2024)   Depression (PHQ2-9)    PHQ-2 Score: 5  Alcohol Screen: Low Risk (11/18/2024)   Alcohol Screen    Last Alcohol Screening Score (AUDIT): 0  Housing: Low Risk (11/18/2024)   Epic    Unable to Pay for Housing in the Last Year: No    Number of Times Moved in the Last Year: 0    Homeless in the Last Year: No  Utilities: Not At Risk (11/18/2024)   Epic    Threatened with loss of utilities: No  Health Literacy: Adequate Health Literacy (11/18/2024)   B1300 Health Literacy    Frequency of need for help with medical instructions: Never   Family Status  Relation Name Status   Mother  Alive   Father  Alive   Sister  Alive   Mat Uncle  (Not Specified)   Bruna Brigham  (Not Specified)   MGM  (Not Specified)   Mat Uncle  (Not Specified)   Mat Uncle  (Not Specified)   PGF  (Not Specified)   Bruna Nyhan  (Not Specified)   Neg Hx  (Not Specified)  No partnership data on file   Family History  Problem Relation Age of Onset   Heart disease Mother 53       MI?   Colon cancer Mother 25   Cystic fibrosis Mother    Anuerysm  Mother    Colon polyps Mother    Stroke Maternal Uncle    Stroke Paternal Uncle    Pancreatic cancer Maternal Grandmother    Diabetes Maternal Grandmother    Esophageal cancer Maternal Uncle    Liver cancer Maternal Uncle  Stomach cancer Paternal Grandfather    Breast cancer Paternal Aunt        20's   Rectal cancer Neg Hx    Allergies[1]  Patient Care Team: Solana Coggin, Roselie Rockford, NP as PCP - General (Internal Medicine)   Medications: Show/hide medication list[2]  Review of Systems  Constitutional:  Negative for activity change, appetite change and unexpected weight change.  Respiratory: Negative.    Cardiovascular: Negative.   Gastrointestinal: Negative.   Endocrine: Negative for cold intolerance and heat intolerance.  Genitourinary: Negative.   Musculoskeletal: Negative.   Skin: Negative.   Neurological: Negative.   Hematological: Negative.   Psychiatric/Behavioral:  Negative for behavioral problems, decreased concentration, dysphoric mood, hallucinations, self-injury, sleep disturbance and suicidal ideas. The patient is not nervous/anxious.         Objective:  BP 126/78 (BP Location: Left Arm, Patient Position: Sitting, Cuff Size: Large)   Pulse 60   Temp 97.6 F (36.4 C) (Oral)   Ht 5' 6 (1.676 m)   Wt 253 lb (114.8 kg)   LMP 10/20/2024   SpO2 99%   BMI 40.84 kg/m     Physical Exam Vitals and nursing note reviewed.  Constitutional:      General: She is not in acute distress. HENT:     Right Ear: Tympanic membrane, ear canal and external ear normal.     Left Ear: Tympanic membrane, ear canal and external ear normal.     Nose: Nose normal.  Eyes:     Extraocular Movements: Extraocular movements intact.     Conjunctiva/sclera: Conjunctivae normal.     Pupils: Pupils are equal, round, and reactive to light.  Neck:     Thyroid: No thyroid mass, thyromegaly or thyroid tenderness.  Cardiovascular:     Rate and Rhythm: Normal rate and regular rhythm.      Pulses: Normal pulses.     Heart sounds: Normal heart sounds.  Pulmonary:     Effort: Pulmonary effort is normal.     Breath sounds: Normal breath sounds.  Abdominal:     General: Bowel sounds are normal.     Palpations: Abdomen is soft.  Musculoskeletal:        General: Normal range of motion.     Cervical back: Normal range of motion and neck supple.     Right lower leg: No edema.     Left lower leg: No edema.  Lymphadenopathy:     Cervical: No cervical adenopathy.  Skin:    General: Skin is warm and dry.  Neurological:     Mental Status: She is alert and oriented to person, place, and time.     Cranial Nerves: No cranial nerve deficit.  Psychiatric:        Mood and Affect: Mood normal.        Behavior: Behavior normal.        Thought Content: Thought content normal.      No results found for any visits on 11/18/24.    Assessment & Plan:    Routine Health Maintenance and Physical Exam  Immunization History  Administered Date(s) Administered   Influenza-Unspecified 07/22/2019   PFIZER(Purple Top)SARS-COV-2 Vaccination 02/10/2020, 03/02/2020   Td 10/24/2002   Tdap 11/29/2019    Health Maintenance  Topic Date Due   Hepatitis B Vaccines 19-59 Average Risk (1 of 3 - 19+ 3-dose series) Never done   Influenza Vaccine  01/18/2025 (Originally 05/21/2024)   Mammogram  02/01/2025   Cervical Cancer Screening (HPV/Pap Cotest)  07/11/2026  DTaP/Tdap/Td (3 - Td or Tdap) 11/28/2029   Colonoscopy  01/09/2030   HPV VACCINES (No Doses Required) Completed   Hepatitis C Screening  Completed   HIV Screening  Completed   Pneumococcal Vaccine  Aged Out   Meningococcal B Vaccine  Aged Out   COVID-19 Vaccine  Discontinued   Discussed health benefits of physical activity, and encouraged her to engage in regular exercise appropriate for her age and condition. Provided contact number to schedule appointment with GI for repeat colonoscopy  Problem List Items Addressed This Visit      Benign essential hypertension   BP at goal with lisinopril /hctz  BP Readings from Last 3 Encounters:  11/18/24 126/78  05/25/24 128/78  10/08/23 126/80    Maintain med dose, refill sent F/up in 6months       Relevant Medications   lisinopril -hydrochlorothiazide  (ZESTORETIC ) 20-12.5 MG tablet   Prediabetes   Repeat hgbA1c Advised about the importance of heart healthy diet F/up in 3-41months      Relevant Orders   Hemoglobin A1c   Other Visit Diagnoses       Encounter for preventative adult health care exam with abnormal findings    -  Primary   Relevant Orders   Comprehensive metabolic panel with GFR     Encounter for lipid screening for cardiovascular disease       Relevant Orders   Lipid panel      Return in about 6 months (around 05/18/2025) for HTN, prediabetes.     Roselie Mood, NP     [1] No Known Allergies [2]  Outpatient Medications Prior to Visit  Medication Sig   Cyanocobalamin (VITAMIN B 12 PO) Take by mouth.   Ferrous Sulfate (IRON PO) Take by mouth.   FOLIC ACID PO Take by mouth.   hydrOXYzine  (ATARAX ) 10 MG tablet Take 1 tablet (10 mg total) by mouth every 8 (eight) hours as needed.   omeprazole  (PRILOSEC OTC) 20 MG tablet Take 20 mg by mouth daily.   [DISCONTINUED] lisinopril -hydrochlorothiazide  (ZESTORETIC ) 20-12.5 MG tablet Take 1 tablet by mouth daily.   No facility-administered medications prior to visit.   "

## 2024-11-18 NOTE — Patient Instructions (Addendum)
 Schedule appointment with GI for repeat colonoscopy: [phone]. Go to lab Maintain Heart healthy diet and daily exercise. Maintain current medications.

## 2024-11-18 NOTE — Assessment & Plan Note (Signed)
 Repeat hgbA1c Advised about the importance of heart healthy diet F/up in 3-2months

## 2024-11-18 NOTE — Assessment & Plan Note (Addendum)
 BP at goal with lisinopril /hctz  BP Readings from Last 3 Encounters:  11/18/24 126/78  05/25/24 128/78  10/08/23 126/80    Maintain med dose, refill sent F/up in 6months

## 2024-11-26 ENCOUNTER — Encounter: Admitting: Nurse Practitioner

## 2024-11-29 ENCOUNTER — Ambulatory Visit: Admitting: Nurse Practitioner

## 2024-12-22 ENCOUNTER — Encounter

## 2025-01-05 ENCOUNTER — Encounter: Admitting: Gastroenterology

## 2025-05-18 ENCOUNTER — Ambulatory Visit: Admitting: Nurse Practitioner
